# Patient Record
Sex: Male | Born: 1960 | Race: White | Hispanic: No | Marital: Married | Smoking: Never smoker
Health system: Southern US, Community
[De-identification: ages and names within clinical notes are randomized; demographics above are authoritative.]

## PROBLEM LIST (undated history)

## (undated) DIAGNOSIS — E785 Hyperlipidemia, unspecified: Secondary | ICD-10-CM

## (undated) DIAGNOSIS — K5792 Diverticulitis of intestine, part unspecified, without perforation or abscess without bleeding: Secondary | ICD-10-CM

## (undated) HISTORY — PX: KNEE SURGERY: SHX244

## (undated) HISTORY — DX: Hyperlipidemia, unspecified: E78.5

---

## 2006-08-30 ENCOUNTER — Ambulatory Visit: Payer: Self-pay | Admitting: Internal Medicine

## 2006-09-28 ENCOUNTER — Ambulatory Visit: Payer: Self-pay | Admitting: Internal Medicine

## 2007-03-20 ENCOUNTER — Encounter: Admission: RE | Admit: 2007-03-20 | Discharge: 2007-03-20 | Payer: Self-pay | Admitting: Orthopedic Surgery

## 2007-06-08 ENCOUNTER — Ambulatory Visit: Payer: Self-pay | Admitting: Internal Medicine

## 2007-06-26 DIAGNOSIS — E785 Hyperlipidemia, unspecified: Secondary | ICD-10-CM | POA: Insufficient documentation

## 2008-07-09 ENCOUNTER — Ambulatory Visit (HOSPITAL_COMMUNITY): Admission: RE | Admit: 2008-07-09 | Discharge: 2008-07-09 | Payer: Self-pay | Admitting: Internal Medicine

## 2010-05-20 ENCOUNTER — Emergency Department (HOSPITAL_COMMUNITY): Admission: EM | Admit: 2010-05-20 | Discharge: 2010-05-20 | Payer: Self-pay | Admitting: Emergency Medicine

## 2011-02-21 LAB — BASIC METABOLIC PANEL
Calcium: 9.1 mg/dL (ref 8.4–10.5)
Chloride: 105 mEq/L (ref 96–112)
Creatinine, Ser: 0.99 mg/dL (ref 0.4–1.5)
GFR calc non Af Amer: >60 mL/min (ref >60)
Glucose, Bld: 129 mg/dL — ABNORMAL HIGH (ref 70–99)
Sodium: 140 mEq/L (ref 135–145)

## 2011-02-21 LAB — DIFFERENTIAL
Basophils Absolute: 0 10*3/uL (ref 0.0–0.1)
Eosinophils Relative: 2 % (ref 0–5)
Lymphocytes Relative: 32 % (ref 12–46)
Monocytes Absolute: 0.5 10*3/uL (ref 0.1–1.0)
Monocytes Relative: 9 % (ref 3–12)
Neutrophils Relative %: 56 % (ref 43–77)

## 2011-02-21 LAB — CBC
Hemoglobin: 14.5 g/dL (ref 13.0–17.0)
MCHC: 34.9 g/dL (ref 30.0–36.0)
Platelets: 227 10*3/uL (ref 150–400)
RBC: 4.39 MIL/uL (ref 4.22–5.81)

## 2011-02-21 LAB — POCT CARDIAC MARKERS
CKMB, poc: <1 ng/mL — ABNORMAL LOW (ref 1.0–8.0)
Troponin i, poc: <0.05 ng/mL (ref 0.00–0.09)

## 2011-10-05 ENCOUNTER — Ambulatory Visit (INDEPENDENT_AMBULATORY_CARE_PROVIDER_SITE_OTHER): Payer: BC Managed Care – PPO | Admitting: Orthopedic Surgery

## 2011-10-05 ENCOUNTER — Encounter: Payer: Self-pay | Admitting: Orthopedic Surgery

## 2011-10-05 VITALS — BP 124/80 | Ht 68.0 in | Wt 170.0 lb

## 2011-10-05 DIAGNOSIS — M24519 Contracture, unspecified shoulder: Secondary | ICD-10-CM | POA: Insufficient documentation

## 2011-10-05 DIAGNOSIS — M67919 Unspecified disorder of synovium and tendon, unspecified shoulder: Secondary | ICD-10-CM

## 2011-10-05 DIAGNOSIS — M75101 Unspecified rotator cuff tear or rupture of right shoulder, not specified as traumatic: Secondary | ICD-10-CM | POA: Insufficient documentation

## 2011-10-05 MED ORDER — METHYLPREDNISOLONE ACETATE 40 MG/ML IJ SUSP: 40.0000 mg | Freq: Once | INTRAMUSCULAR | Status: AC

## 2011-10-05 NOTE — Patient Instructions (Signed)
You have received a steroid shot. 15% of patients experience increased pain at the injection site with in the next 24 hours. This is best treated with ice and tylenol extra strength 2 tabs every 8 hours. If you are still having pain please call the office.   The therabands are at [facility] "3 level bands"

## 2011-10-05 NOTE — Progress Notes (Signed)
Pain RIGHT shoulder x4 months.  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 Denies sharp, dull, throbbing, stabbing, or burning, numbness, tingling, or locking. Symptoms seem to come and go.  Review of systems negative times. All 14 systems.  Patient is very healthy, had surgery for a torn ligament, and takes Crestor.  History reviewed. No pertinent past medical history.  Past Surgical History  Procedure Date  . Knee surgery     Family History  Problem Relation Age of Onset  . Cancer    . Diabetes     Physical Exam(12) GENERAL: normal development   CDV: pulses are normal   Skin: normal  Lymph: nodes were not palpable/normal  Psychiatric: awake, alert and oriented  Neuro: normal sensation  MSK gait normal 1shoulder AC joint non tender  2 PROM was normal with pain at 150 degrees 3 stable shoulder  4 RC muscles normal  5 neck non tender  6 neck ROM is normal   Xrays:  Separately identifiable x-ray report   AP and lateral with 10 caudal tilt right  shoulder  Normal glenohumeral joint normal rotator outlet but no signs of chronic cuff disease  Impression normal shoulder x-rays   Assessment: RC syndrome, Adhesive capsulitis    Plan: injection HEP   Shoulder Injection Procedure Note   Pre-operative Diagnosis: right  RC Syndrome  Post-operative Diagnosis: same  Indications: pain   Anesthesia: ethyl chloride   Procedure Details   Verbal consent was obtained for the procedure. The shoulder was prepped withalcohol and the skin was anesthetized. A 20 gauge needle was advanced into the subacromial space through posterior approach without difficulty  The space was then injected with 3 ml 1% lidocaine and 1 ml of depomedrol. The injection site was cleansed with isopropyl alcohol and a dressing was applied.  Complications:  None; patient  tolerated the procedure well.

## 2011-11-16 ENCOUNTER — Ambulatory Visit: Payer: BC Managed Care – PPO | Admitting: Orthopedic Surgery

## 2015-06-30 ENCOUNTER — Other Ambulatory Visit (HOSPITAL_COMMUNITY): Payer: Self-pay | Admitting: Internal Medicine

## 2015-06-30 DIAGNOSIS — R42 Dizziness and giddiness: Secondary | ICD-10-CM

## 2015-07-02 ENCOUNTER — Ambulatory Visit (HOSPITAL_COMMUNITY)
Admission: RE | Admit: 2015-07-02 | Discharge: 2015-07-02 | Disposition: A | Discharge status: Home / Self Care | Payer: BLUE CROSS/BLUE SHIELD | Source: Ambulatory Visit | Attending: Internal Medicine | Admitting: Internal Medicine

## 2015-07-02 DIAGNOSIS — R42 Dizziness and giddiness: Secondary | ICD-10-CM

## 2015-07-02 DIAGNOSIS — I6522 Occlusion and stenosis of left carotid artery: Secondary | ICD-10-CM | POA: Diagnosis not present

## 2015-07-02 DIAGNOSIS — I6521 Occlusion and stenosis of right carotid artery: Secondary | ICD-10-CM | POA: Insufficient documentation

## 2015-08-08 IMAGING — US US CAROTID DUPLEX BILAT
1 series · 13 of 24 positions shown · non-contrast
Comparison: None.

CLINICAL DATA: 54-year-old male with a 1 year history of
intermittent

EXAM:
BILATERAL CAROTID DUPLEX ULTRASOUND
TECHNIQUE: Gray scale imaging, color Doppler and duplex ultrasound were
performed of bilateral carotid and vertebral arteries in the neck.

[Series 1: us carotid duplex bilat · 0.05mm/px · 13 of 73 slices shown]
[im 1/73]
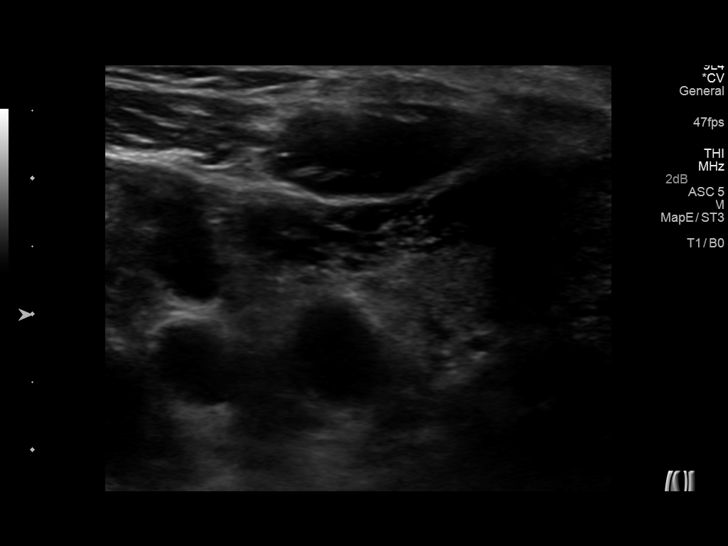
[im 7/73]
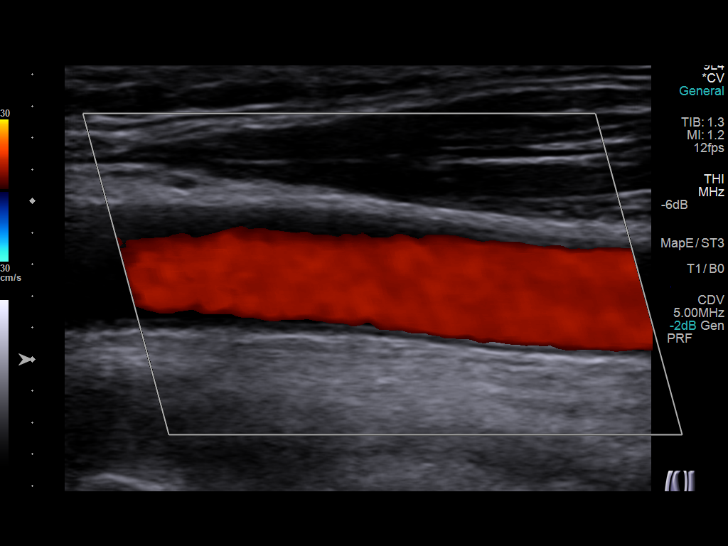
[im 13/73]
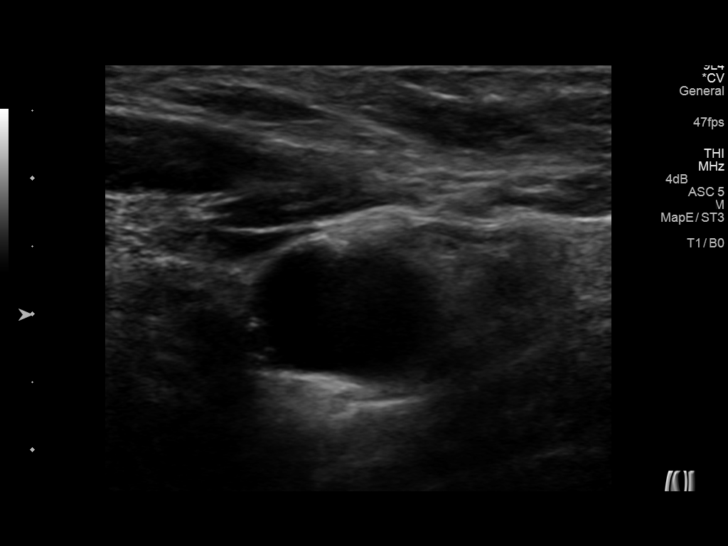
[im 19/73]
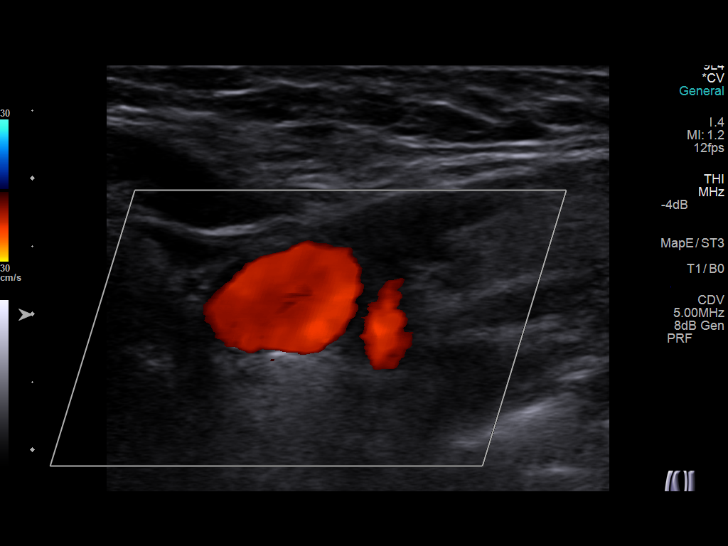
[im 26/73]
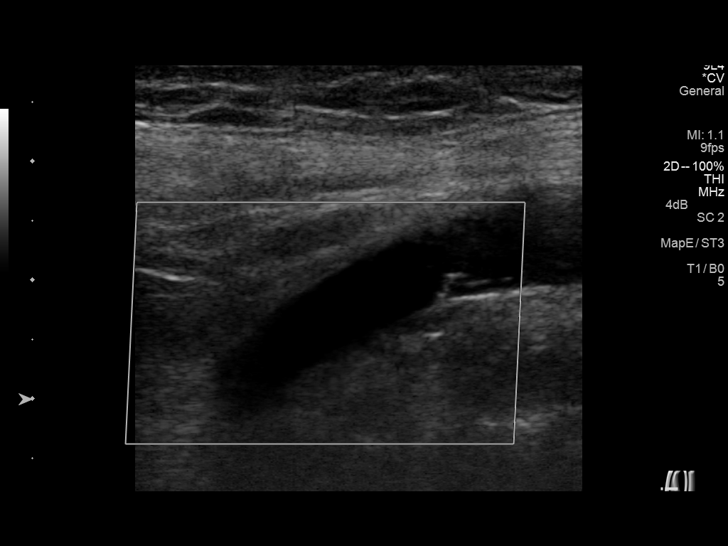
[im 32/73]
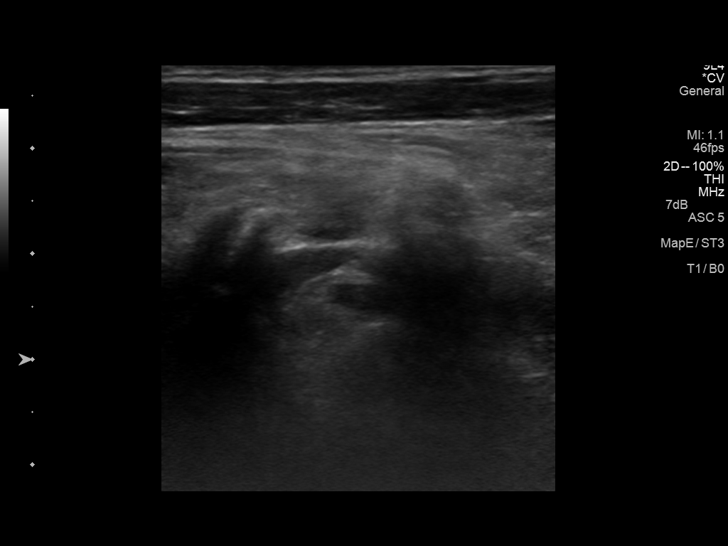
[im 38/73]
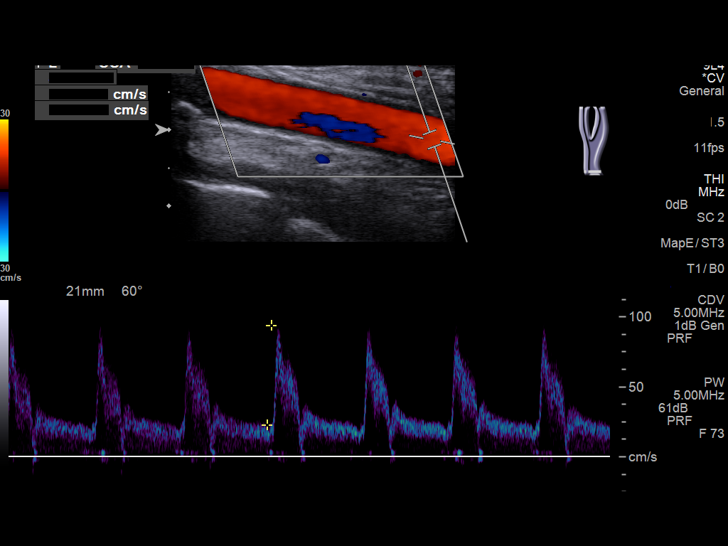
[im 41/73]
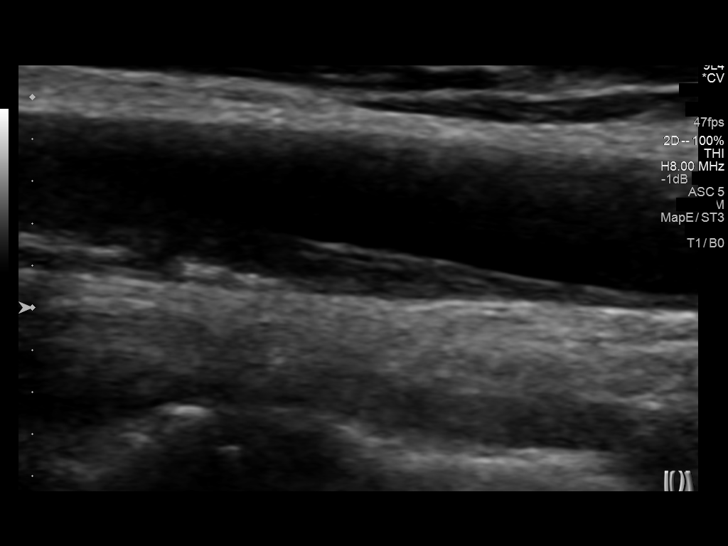
[im 47/73]
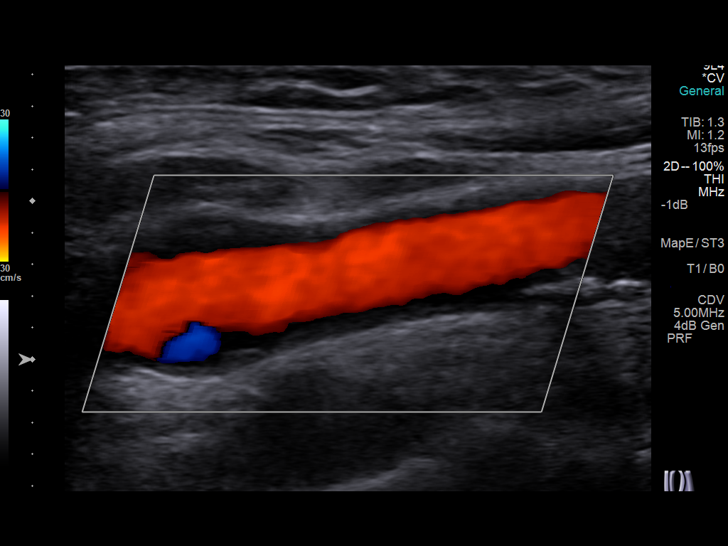
[im 54/73]
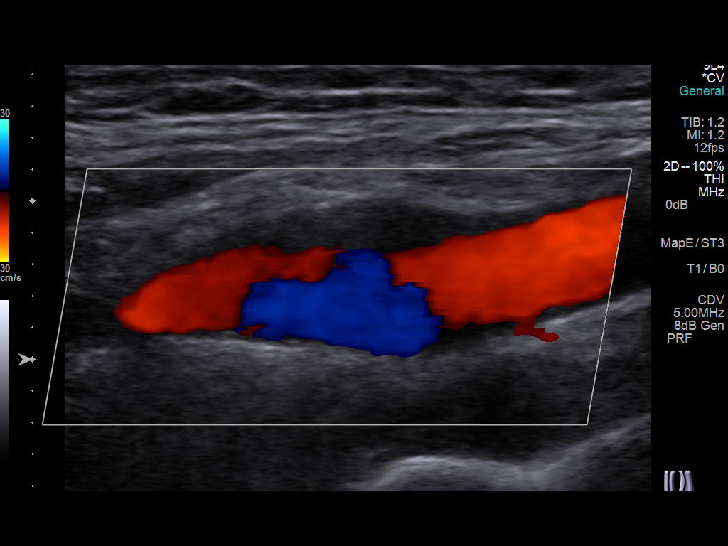
[im 60/73]
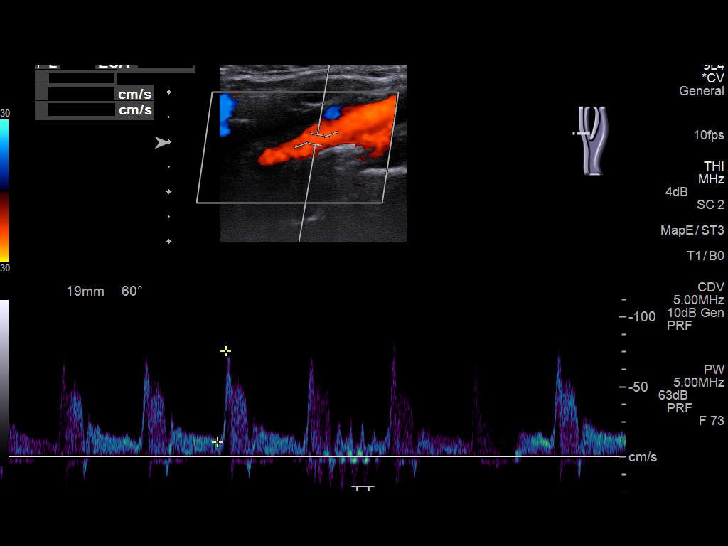
[im 66/73]
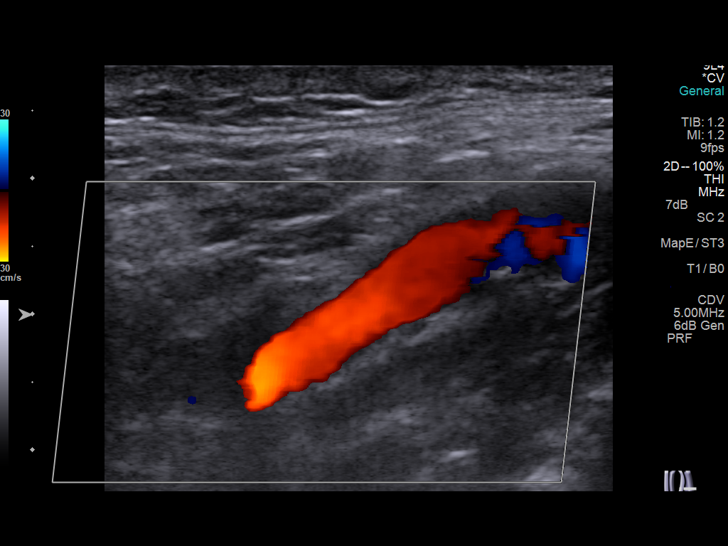
[im 73/73]
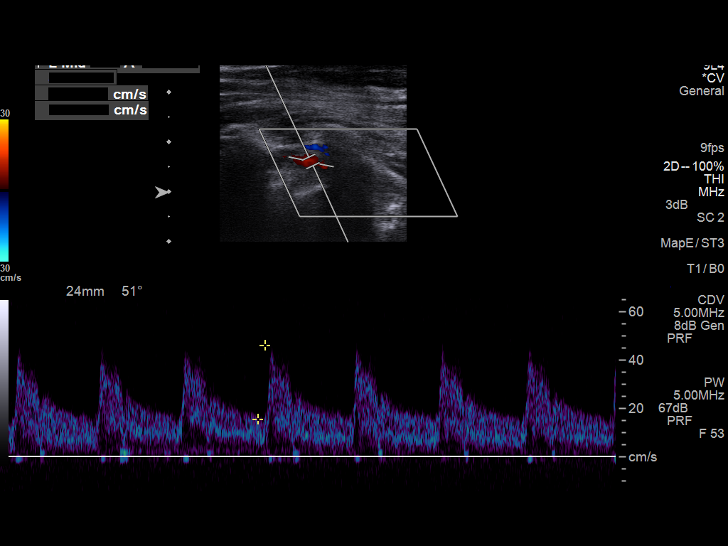

[13 of 24 positions shown; findings below may reference images not displayed]

FINDINGS: Criteria: Quantification of carotid stenosis is based on velocity
parameters that correlate the residual internal carotid diameter
with NASCET-based stenosis levels, using the diameter of the distal
internal carotid lumen as the denominator for stenosis measurement.

The following velocity measurements were obtained:

RIGHT

ICA:  81/35 cm/sec

CCA:  92/26 cm/sec

SYSTOLIC ICA/CCA RATIO:

DIASTOLIC ICA/CCA RATIO:

ECA:  85 cm/sec

LEFT

ICA:  54/26 cm/sec

CCA:  84/19 cm/sec

SYSTOLIC ICA/CCA RATIO:

DIASTOLIC ICA/CCA RATIO:

ECA:  76 cm/sec

RIGHT CAROTID ARTERY: Trace heterogeneous atherosclerotic plaque in
the internal carotid artery. By peak systolic velocity criteria, the
estimated stenosis remains less than 50%.

RIGHT VERTEBRAL ARTERY:  Patent with normal antegrade flow.

LEFT CAROTID ARTERY: Mild smooth heterogeneous plaque in the mid and
distal common carotid artery. No significant stenosis. Heterogeneous
plaque present within the proximal internal carotid artery. By peak
systolic velocity criteria, the estimated stenosis remains less than
50%.

LEFT VERTEBRAL ARTERY:  Patent with normal antegrade flow.
IMPRESSION: 1. Mild (1-49%) stenosis proximal right internal carotid artery
secondary to mild focal heterogeneous atherosclerotic plaque.
2. Mild (1-49%) stenosis proximal left internal carotid artery
secondary to smooth focal heterogeneous atherosclerotic plaque.
3. Vertebral arteries are patent with normal antegrade flow.

## 2016-01-22 ENCOUNTER — Ambulatory Visit (INDEPENDENT_AMBULATORY_CARE_PROVIDER_SITE_OTHER): Payer: BC Managed Care – PPO | Admitting: Otolaryngology

## 2016-05-09 ENCOUNTER — Encounter (HOSPITAL_COMMUNITY): Payer: Self-pay | Admitting: Emergency Medicine

## 2016-05-09 ENCOUNTER — Emergency Department (HOSPITAL_COMMUNITY)
Admission: EM | Admit: 2016-05-09 | Discharge: 2016-05-09 | Disposition: A | Discharge status: Home / Self Care | Payer: BLUE CROSS/BLUE SHIELD | Attending: Emergency Medicine | Admitting: Emergency Medicine

## 2016-05-09 DIAGNOSIS — R1032 Left lower quadrant pain: Secondary | ICD-10-CM | POA: Diagnosis not present

## 2016-05-09 HISTORY — DX: Diverticulitis of intestine, part unspecified, without perforation or abscess without bleeding: K57.92

## 2016-05-09 LAB — CBC WITH DIFFERENTIAL/PLATELET
BASOS ABS: 0 10*3/uL (ref 0.0–0.1)
Basophils Relative: 0 %
Eosinophils Absolute: 0.1 10*3/uL (ref 0.0–0.7)
Eosinophils Relative: 1 %
HEMATOCRIT: 41.4 % (ref 39.0–52.0)
Hemoglobin: 13.7 g/dL (ref 13.0–17.0)
LYMPHS PCT: 18 %
Lymphs Abs: 1.9 10*3/uL (ref 0.7–4.0)
MCH: 31.1 pg (ref 26.0–34.0)
MCHC: 33.1 g/dL (ref 30.0–36.0)
MCV: 94.1 fL (ref 78.0–100.0)
MONO ABS: 1.1 10*3/uL — AB (ref 0.1–1.0)
Monocytes Relative: 11 %
NEUTROS ABS: 7.3 10*3/uL (ref 1.7–7.7)
Neutrophils Relative %: 70 %
Platelets: 238 10*3/uL (ref 150–400)
RBC: 4.4 MIL/uL (ref 4.22–5.81)
RDW: 12.4 % (ref 11.5–15.5)
WBC: 10.5 10*3/uL (ref 4.0–10.5)

## 2016-05-09 LAB — BASIC METABOLIC PANEL
ANION GAP: 6 (ref 5–15)
BUN: 12 mg/dL (ref 6–20)
CO2: 28 mmol/L (ref 22–32)
Calcium: 8.8 mg/dL — ABNORMAL LOW (ref 8.9–10.3)
Chloride: 104 mmol/L (ref 101–111)
Creatinine, Ser: 1.03 mg/dL (ref 0.61–1.24)
GFR calc Af Amer: >60 mL/min (ref >60)
GFR calc non Af Amer: >60 mL/min (ref >60)
GLUCOSE: 118 mg/dL — AB (ref 65–99)
POTASSIUM: 4.1 mmol/L (ref 3.5–5.1)
Sodium: 138 mmol/L (ref 135–145)

## 2016-05-09 LAB — URINALYSIS, ROUTINE W REFLEX MICROSCOPIC
Bilirubin Urine: NEGATIVE
GLUCOSE, UA: NEGATIVE mg/dL
Hgb urine dipstick: NEGATIVE
Ketones, ur: NEGATIVE mg/dL
LEUKOCYTES UA: NEGATIVE
Nitrite: NEGATIVE
PH: 7.5 (ref 5.0–8.0)
Protein, ur: NEGATIVE mg/dL
SPECIFIC GRAVITY, URINE: 1.01 (ref 1.005–1.030)

## 2016-05-09 MED ORDER — METRONIDAZOLE 500 MG PO TABS
500.0000 mg | ORAL_TABLET | Freq: Once | ORAL | Status: AC
  Administered 2016-05-09: 500 mg via ORAL
  Filled 2016-05-09: qty 1

## 2016-05-09 MED ORDER — IBUPROFEN 800 MG PO TABS: 800.0000 mg | ORAL_TABLET | Freq: Three times a day (TID) | ORAL | Status: DC

## 2016-05-09 MED ORDER — HYDROCODONE-ACETAMINOPHEN 5-325 MG PO TABS: 2.0000 | ORAL_TABLET | ORAL | Status: DC | PRN

## 2016-05-09 MED ORDER — CIPROFLOXACIN IN D5W 400 MG/200ML IV SOLN
400.0000 mg | Freq: Once | INTRAVENOUS | Status: AC
  Administered 2016-05-09: 400 mg via INTRAVENOUS
  Filled 2016-05-09: qty 200

## 2016-05-09 MED ORDER — METRONIDAZOLE 500 MG PO TABS: 500.0000 mg | ORAL_TABLET | Freq: Two times a day (BID) | ORAL | Status: DC

## 2016-05-09 MED ORDER — CIPROFLOXACIN HCL 500 MG PO TABS: 500.0000 mg | ORAL_TABLET | Freq: Two times a day (BID) | ORAL | Status: DC

## 2016-05-09 NOTE — Discharge Instructions (Signed)

## 2016-05-09 NOTE — ED Notes (Signed)
Patient c/o lower abd pain. Denies any nausea, vomiting, diarrhea, or urinary symptoms. Patient unsure of any fevers but reports chills last night. Per patient last normal BM yesterday-no blood noted.

## 2016-05-09 NOTE — ED Provider Notes (Signed)
CSN: [id number]     Arrival date & time 05/09/16  [id number] History   First MD Initiated Contact with Patient 05/09/16 [phone]     Chief Complaint  Patient presents with  . Abdominal Pain     (Consider location/radiation/quality/duration/timing/severity/associated sxs/prior Treatment) HPI Comments: The patient is a 55 year old male who has a history of diverticulitis a couple of years ago, he has no other prior abdominal surgical history. He reports over the last couple of days he has had some left lower quadrant abdominal pain but no nausea vomiting diarrhea constipation changes and bloody stools, urinary discomfort or frequency and no radiation of the pain to the right side. He has no fevers or chills, he has no other complaints, the pain has been constant, it is gradually intensifying.  Patient is a 55 y.o. male presenting with abdominal pain. The history is provided by the patient.  Abdominal Pain   Past Medical History  Diagnosis Date  . Diverticulitis    Past Surgical History  Procedure Laterality Date  . Knee surgery     Family History  Problem Relation Age of Onset  . Cancer    . Diabetes     Social History  Substance Use Topics  . Smoking status: Never Smoker   . Smokeless tobacco: None  . Alcohol Use: No     Comment: 3 drinks a week    Review of Systems  Gastrointestinal: Positive for abdominal pain.  All other systems reviewed and are negative.     Allergies  Review of patient's allergies indicates no known allergies.  Home Medications   Prior to Admission medications   Medication Sig Start Date End Date Taking? Authorizing Provider  ciprofloxacin (CIPRO) 500 MG tablet Take 1 tablet (500 mg total) by mouth every 12 (twelve) hours. 05/09/16   Eber HongBrian Loeta Herst, MD  HYDROcodone-acetaminophen (NORCO/VICODIN) 5-325 MG tablet Take 2 tablets by mouth every 4 (four) hours as needed. 05/09/16   Eber HongBrian Quintavius Niebuhr, MD  ibuprofen (ADVIL,MOTRIN) 800 MG tablet Take 1 tablet (800 mg total)  by mouth 3 (three) times daily. 05/09/16   Eber HongBrian Shooter Tangen, MD  metroNIDAZOLE (FLAGYL) 500 MG tablet Take 1 tablet (500 mg total) by mouth 2 (two) times daily. 05/09/16   Eber HongBrian Austyn Seier, MD  Rosuvastatin Calcium (CRESTOR PO) Take by mouth.      Historical Provider, MD   BP 132/81 mmHg  Pulse 80  Temp(Src) 98.6 F (37 C) (Oral)  Resp 18  Ht 5\' 8"  (1.727 m)  Wt 168 lb (76.204 kg)  BMI 25.55 kg/m2  SpO2 95% Physical Exam  Constitutional: He appears well-developed and well-nourished. No distress.  HENT:  Head: Normocephalic and atraumatic.  Mouth/Throat: Oropharynx is clear and moist. No oropharyngeal exudate.  Eyes: Conjunctivae and EOM are normal. Pupils are equal, round, and reactive to light. Right eye exhibits no discharge. Left eye exhibits no discharge. No scleral icterus.  Neck: Normal range of motion. Neck supple. No JVD present. No thyromegaly present.  Cardiovascular: Normal rate, regular rhythm, normal heart sounds and intact distal pulses.  Exam reveals no gallop and no friction rub.   No murmur heard. Pulmonary/Chest: Effort normal and breath sounds normal. No respiratory distress. He has no wheezes. He has no rales.  Abdominal: Soft. Bowel sounds are normal. He exhibits no distension and no mass. There is tenderness (focal tenderness to the left lower quadrant and left mid abdomen, no guarding, no peritoneal signs, no rigidity). There is no rebound and no guarding.  Musculoskeletal: Normal range  of motion. He exhibits no edema or tenderness.  Lymphadenopathy:    He has no cervical adenopathy.  Neurological: He is alert. Coordination normal.  Skin: Skin is warm and dry. No rash noted. No erythema.  Psychiatric: He has a normal mood and affect. His behavior is normal.  Nursing note and vitals reviewed.   ED Course  Procedures (including critical care time) Labs Review Labs Reviewed  CBC WITH DIFFERENTIAL/PLATELET - Abnormal; Notable for the following:    Monocytes Absolute 1.1  (*)    All other components within normal limits  BASIC METABOLIC PANEL - Abnormal; Notable for the following:    Glucose, Bld 118 (*)    Calcium 8.8 (*)    All other components within normal limits  URINALYSIS, ROUTINE W REFLEX MICROSCOPIC (NOT AT [facility])    Imaging Review No results found. I have personally reviewed and evaluated these images and lab results as part of my medical decision-making.   MDM   Final diagnoses:  Left lower quadrant pain    The patient's exam is unremarkable other than his abdominal pain. His exam is not consistent with perforation, there is no fever or tachycardia to suggest an abscess or perforation, I will obtain blood counts, place an IV, he will need IV antibiotics if he has diverticulitis, he appears very comfortable and has declined pain medications at this time. Doubt abscess or perforation. We'll proceed with CT scan imaging only for severe elevation in white blood cell count or worsening of clinical condition. I discussed this plan with the patient, he is in agreement to proceed without CT scan at this time.  Would also consider Kidney Stone or UTI / pyelo - but less likely - UA ordered  Labs reassuring meds as below Pt informed of results.  Meds given in ED:  Medications  ciprofloxacin (CIPRO) IVPB 400 mg (not administered)  metroNIDAZOLE (FLAGYL) tablet 500 mg (not administered)    New Prescriptions   CIPROFLOXACIN (CIPRO) 500 MG TABLET    Take 1 tablet (500 mg total) by mouth every 12 (twelve) hours.   HYDROCODONE-ACETAMINOPHEN (NORCO/VICODIN) 5-325 MG TABLET    Take 2 tablets by mouth every 4 (four) hours as needed.   IBUPROFEN (ADVIL,MOTRIN) 800 MG TABLET    Take 1 tablet (800 mg total) by mouth 3 (three) times daily.   METRONIDAZOLE (FLAGYL) 500 MG TABLET    Take 1 tablet (500 mg total) by mouth 2 (two) times daily.      Eber Hong, MD 05/09/16 [phone]

## 2016-05-18 ENCOUNTER — Other Ambulatory Visit: Payer: Self-pay | Admitting: Otolaryngology

## 2016-05-18 DIAGNOSIS — H9191 Unspecified hearing loss, right ear: Secondary | ICD-10-CM

## 2016-06-01 ENCOUNTER — Ambulatory Visit
Admission: RE | Admit: 2016-06-01 | Discharge: 2016-06-01 | Disposition: A | Discharge status: Home / Self Care | Payer: BLUE CROSS/BLUE SHIELD | Source: Ambulatory Visit | Attending: Otolaryngology | Admitting: Otolaryngology

## 2016-06-01 DIAGNOSIS — H9191 Unspecified hearing loss, right ear: Secondary | ICD-10-CM

## 2016-06-01 MED ORDER — GADOBENATE DIMEGLUMINE 529 MG/ML IV SOLN
15.0000 mL | Freq: Once | INTRAVENOUS | Status: AC | PRN
Start: 2016-06-01 — End: 2016-06-01
  Administered 2016-06-01: 15 mL via INTRAVENOUS

## 2016-06-10 ENCOUNTER — Ambulatory Visit (INDEPENDENT_AMBULATORY_CARE_PROVIDER_SITE_OTHER): Payer: BLUE CROSS/BLUE SHIELD | Admitting: Otolaryngology

## 2016-06-10 DIAGNOSIS — H9041 Sensorineural hearing loss, unilateral, right ear, with unrestricted hearing on the contralateral side: Secondary | ICD-10-CM | POA: Diagnosis not present

## 2016-06-10 DIAGNOSIS — H8109 Meniere's disease, unspecified ear: Secondary | ICD-10-CM

## 2016-06-10 DIAGNOSIS — R42 Dizziness and giddiness: Secondary | ICD-10-CM | POA: Diagnosis not present

## 2016-06-16 DIAGNOSIS — H9313 Tinnitus, bilateral: Secondary | ICD-10-CM | POA: Diagnosis not present

## 2016-06-23 DIAGNOSIS — H9313 Tinnitus, bilateral: Secondary | ICD-10-CM | POA: Diagnosis not present

## 2016-07-07 DIAGNOSIS — H9313 Tinnitus, bilateral: Secondary | ICD-10-CM | POA: Diagnosis not present

## 2016-07-14 DIAGNOSIS — H9313 Tinnitus, bilateral: Secondary | ICD-10-CM | POA: Diagnosis not present

## 2016-07-21 DIAGNOSIS — H9313 Tinnitus, bilateral: Secondary | ICD-10-CM | POA: Diagnosis not present

## 2016-08-04 DIAGNOSIS — H9313 Tinnitus, bilateral: Secondary | ICD-10-CM | POA: Diagnosis not present

## 2016-08-09 DIAGNOSIS — H9313 Tinnitus, bilateral: Secondary | ICD-10-CM | POA: Diagnosis not present

## 2016-09-16 ENCOUNTER — Ambulatory Visit (INDEPENDENT_AMBULATORY_CARE_PROVIDER_SITE_OTHER): Payer: BLUE CROSS/BLUE SHIELD | Admitting: Otolaryngology

## 2016-09-16 DIAGNOSIS — R42 Dizziness and giddiness: Secondary | ICD-10-CM | POA: Diagnosis not present

## 2016-09-16 DIAGNOSIS — H8101 Meniere's disease, right ear: Secondary | ICD-10-CM

## 2016-09-16 DIAGNOSIS — H9311 Tinnitus, right ear: Secondary | ICD-10-CM | POA: Diagnosis not present

## 2016-09-16 DIAGNOSIS — H8112 Benign paroxysmal vertigo, left ear: Secondary | ICD-10-CM

## 2016-09-16 DIAGNOSIS — H9041 Sensorineural hearing loss, unilateral, right ear, with unrestricted hearing on the contralateral side: Secondary | ICD-10-CM

## 2016-10-06 DIAGNOSIS — H9313 Tinnitus, bilateral: Secondary | ICD-10-CM | POA: Diagnosis not present

## 2016-10-13 DIAGNOSIS — H9313 Tinnitus, bilateral: Secondary | ICD-10-CM | POA: Diagnosis not present

## 2016-10-27 DIAGNOSIS — H9313 Tinnitus, bilateral: Secondary | ICD-10-CM | POA: Diagnosis not present

## 2017-04-28 DIAGNOSIS — Z6824 Body mass index (BMI) 24.0-24.9, adult: Secondary | ICD-10-CM | POA: Diagnosis not present

## 2017-04-28 DIAGNOSIS — E785 Hyperlipidemia, unspecified: Secondary | ICD-10-CM | POA: Diagnosis not present

## 2017-04-28 DIAGNOSIS — Z0001 Encounter for general adult medical examination with abnormal findings: Secondary | ICD-10-CM | POA: Diagnosis not present

## 2017-12-23 DIAGNOSIS — L570 Actinic keratosis: Secondary | ICD-10-CM | POA: Diagnosis not present

## 2018-05-08 DIAGNOSIS — J029 Acute pharyngitis, unspecified: Secondary | ICD-10-CM | POA: Diagnosis not present

## 2018-05-08 DIAGNOSIS — Z6825 Body mass index (BMI) 25.0-25.9, adult: Secondary | ICD-10-CM | POA: Diagnosis not present

## 2018-06-23 DIAGNOSIS — D485 Neoplasm of uncertain behavior of skin: Secondary | ICD-10-CM | POA: Diagnosis not present

## 2018-06-23 DIAGNOSIS — L820 Inflamed seborrheic keratosis: Secondary | ICD-10-CM | POA: Diagnosis not present

## 2018-06-23 DIAGNOSIS — D2372 Other benign neoplasm of skin of left lower limb, including hip: Secondary | ICD-10-CM | POA: Diagnosis not present

## 2018-07-20 DIAGNOSIS — R1030 Lower abdominal pain, unspecified: Secondary | ICD-10-CM | POA: Diagnosis not present

## 2018-07-21 ENCOUNTER — Other Ambulatory Visit: Payer: Self-pay | Admitting: *Deleted

## 2018-07-21 ENCOUNTER — Ambulatory Visit (HOSPITAL_COMMUNITY): Payer: BLUE CROSS/BLUE SHIELD

## 2018-07-21 ENCOUNTER — Other Ambulatory Visit (HOSPITAL_COMMUNITY): Payer: Self-pay | Admitting: *Deleted

## 2018-07-21 DIAGNOSIS — R1032 Left lower quadrant pain: Secondary | ICD-10-CM

## 2018-07-21 DIAGNOSIS — N50812 Left testicular pain: Secondary | ICD-10-CM

## 2018-07-28 ENCOUNTER — Ambulatory Visit
Admission: RE | Admit: 2018-07-28 | Discharge: 2018-07-28 | Disposition: A | Discharge status: Home / Self Care | Payer: BLUE CROSS/BLUE SHIELD | Source: Ambulatory Visit | Attending: *Deleted | Admitting: *Deleted

## 2018-07-28 DIAGNOSIS — N50812 Left testicular pain: Secondary | ICD-10-CM

## 2018-07-28 DIAGNOSIS — R1032 Left lower quadrant pain: Secondary | ICD-10-CM

## 2018-07-28 DIAGNOSIS — N433 Hydrocele, unspecified: Secondary | ICD-10-CM | POA: Diagnosis not present

## 2018-07-31 ENCOUNTER — Other Ambulatory Visit: Payer: BLUE CROSS/BLUE SHIELD

## 2018-08-17 DIAGNOSIS — F5101 Primary insomnia: Secondary | ICD-10-CM | POA: Diagnosis not present

## 2018-08-17 DIAGNOSIS — E785 Hyperlipidemia, unspecified: Secondary | ICD-10-CM | POA: Diagnosis not present

## 2018-08-17 DIAGNOSIS — N5089 Other specified disorders of the male genital organs: Secondary | ICD-10-CM | POA: Diagnosis not present

## 2018-08-17 DIAGNOSIS — K4090 Unilateral inguinal hernia, without obstruction or gangrene, not specified as recurrent: Secondary | ICD-10-CM | POA: Diagnosis not present

## 2018-08-31 DIAGNOSIS — N50812 Left testicular pain: Secondary | ICD-10-CM | POA: Diagnosis not present

## 2018-08-31 DIAGNOSIS — Z125 Encounter for screening for malignant neoplasm of prostate: Secondary | ICD-10-CM | POA: Diagnosis not present

## 2018-09-03 IMAGING — US US SCROTUM W/ DOPPLER COMPLETE
1 series · 13 of 25 positions shown · non-contrast
Comparison: None.

CLINICAL DATA: 57 y/o  M; left testicle and groin pain for 2 weeks.

EXAM:
SCROTAL ULTRASOUND
DOPPLER ULTRASOUND OF THE TESTICLES
TECHNIQUE: Complete ultrasound examination of the testicles, epididymis, and
other scrotal structures was performed. Color and spectral Doppler
ultrasound were also utilized to evaluate blood flow to the
testicles.

[Series 1: us scrotum w/ doppler complete · 0.08mm/px · 89 acquisitions, 13 frames shown]
[im 1/89]
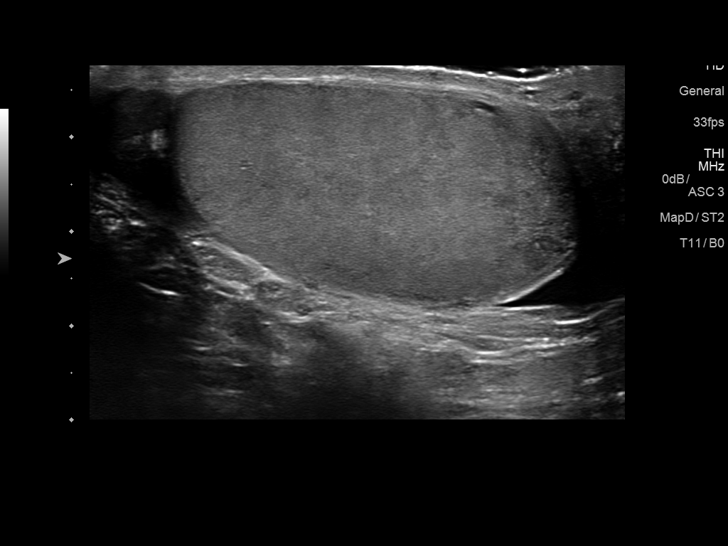
[im 8/89]
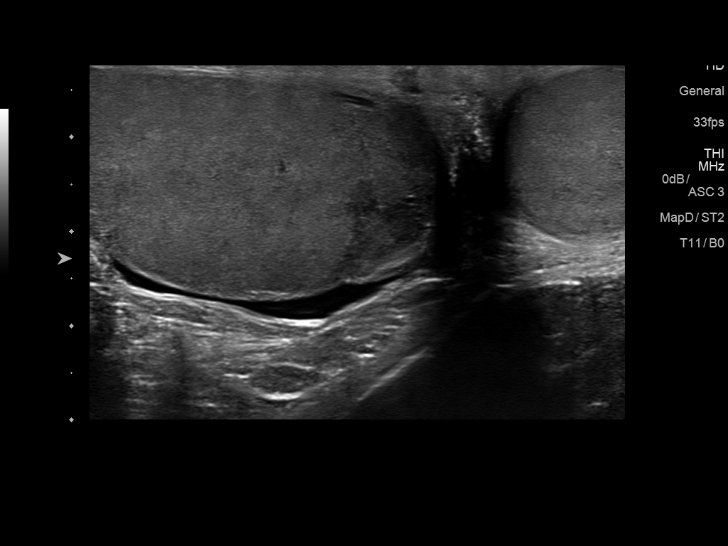
[im 15/89]
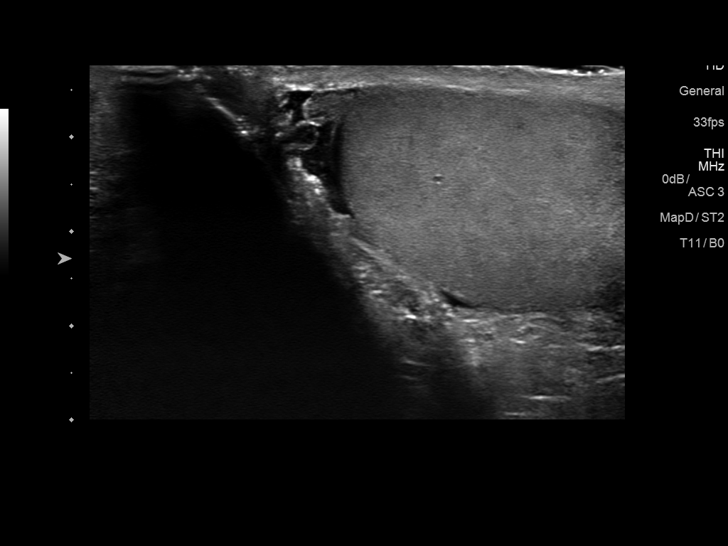
[im 23/89]
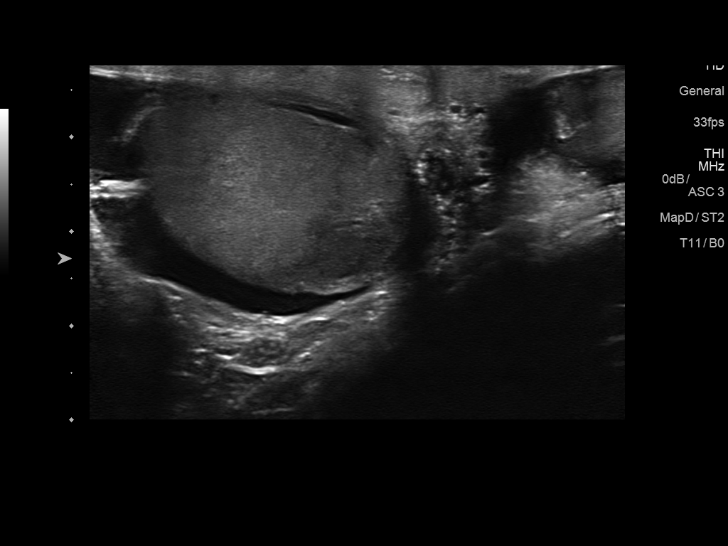
[im 30/89]
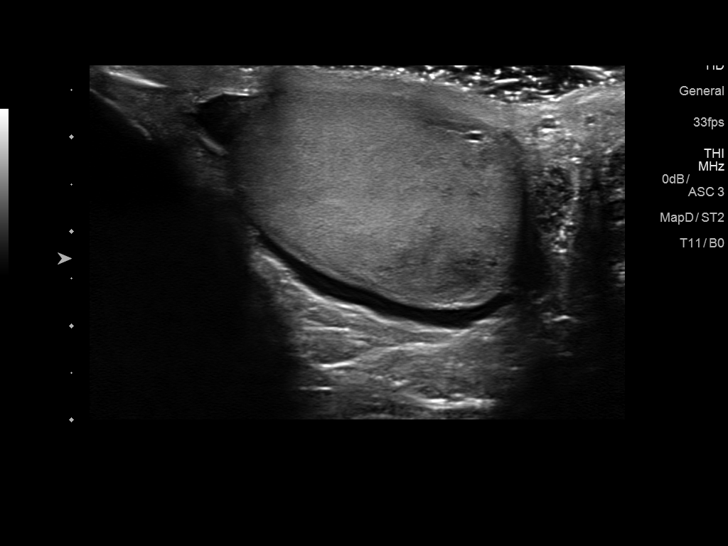
[im 37/89]
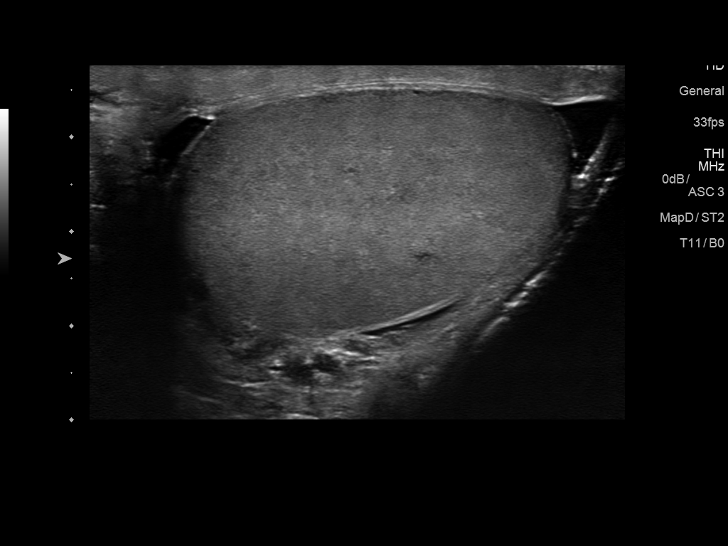
[im 45/89]
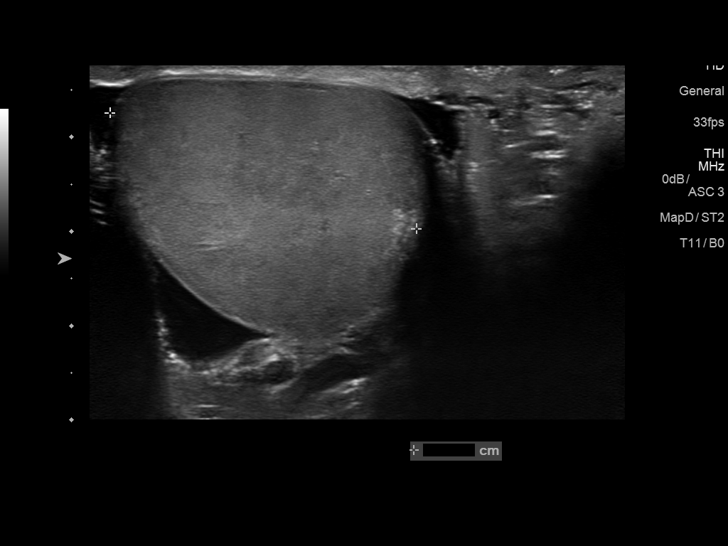
[im 52/89]
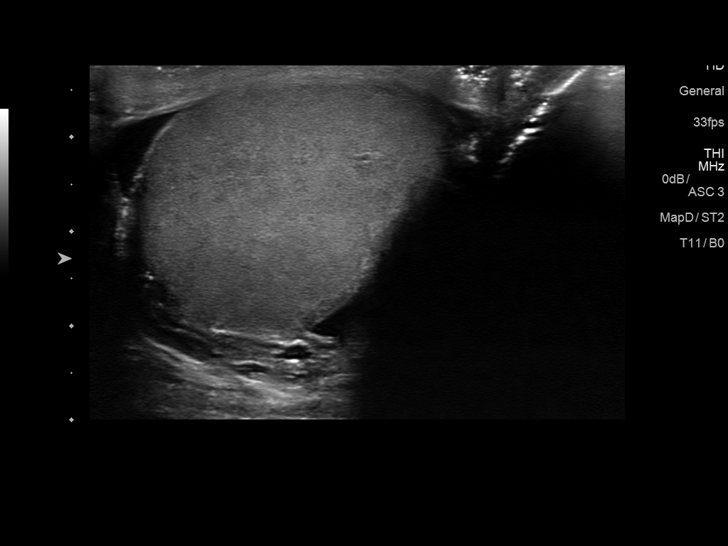
[im 59/89]
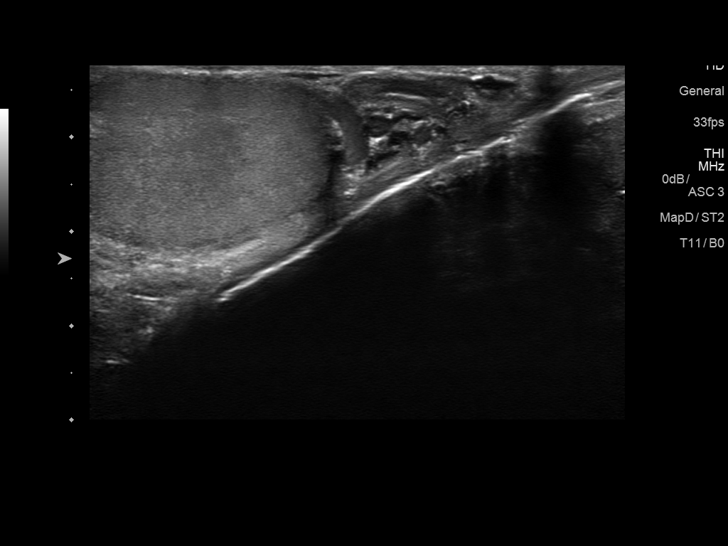
[im 67/89]
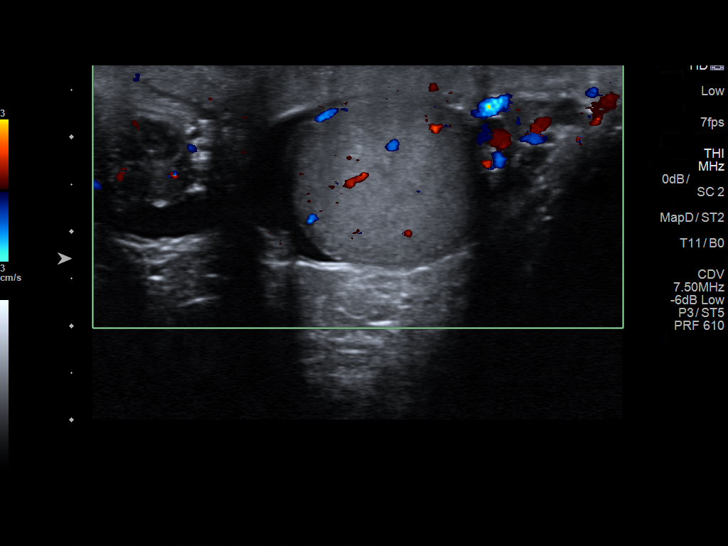
[im 74/89]
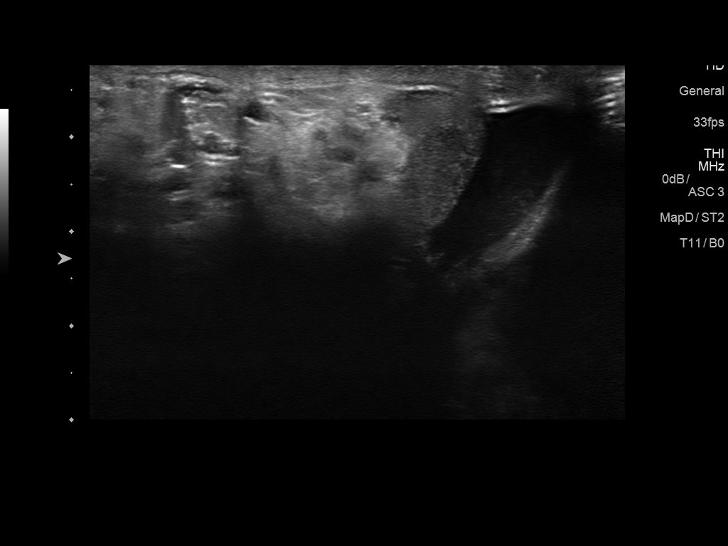
[im 81/89]
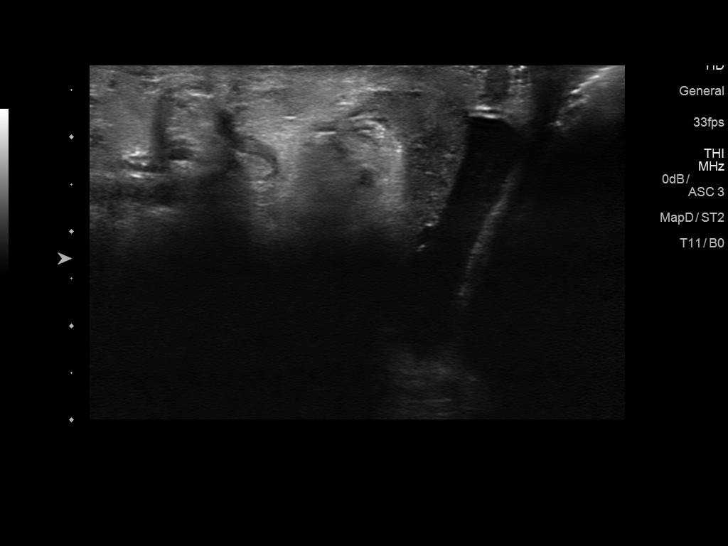
[im 89/89]
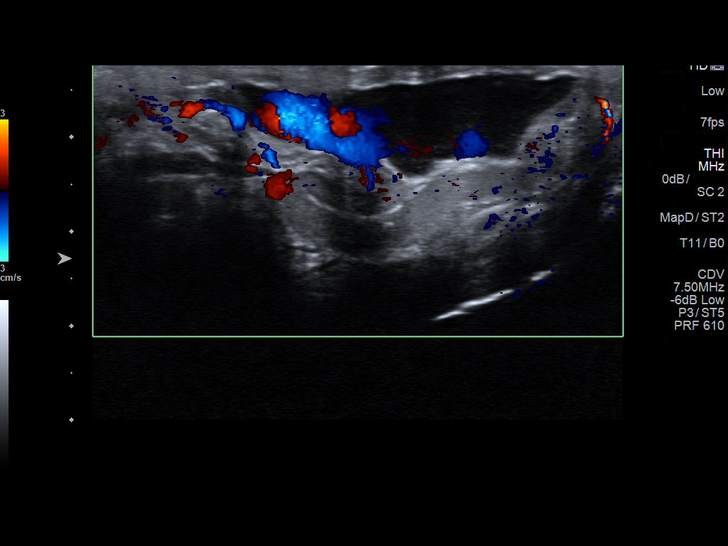

[13 of 25 positions shown; findings below may reference images not displayed]

FINDINGS: Right testicle

Measurements: 4.3 x 2.8 x 3.6 cm. Hypoechoic focus within the
inferior right testis measuring 8 x 7 x 8 mm. Blood flow detected
within the area on color Doppler.

Left testicle

Measurements: 4.3 x 2.7 x 3.2 cm. No mass or microlithiasis
visualized.

Right epididymis:  6 mm cyst.  5 mm appendix.

Left epididymis:  5 mm cyst.

Hydrocele: Bilateral hydrocele. There is debris within the
left-sided hydrocele.

Varicocele:  None visualized.

Pulsed Doppler interrogation of both testes demonstrates normal low
resistance arterial and venous waveforms bilaterally.

Other: Bulging structure within left inguinal canal without bowel
wall signal, possibly small hernia containing fat.
IMPRESSION: 1. No findings of testicular torsion or epididymo-orchitis at this
time.
2. Nonspecific 8 mm hypoechoic focus within the inferior right
testis with blood flow. Findings may represent sequelae of old
trauma or testicular neoplasm including testicular carcinoma,
lymphoma, metastasis, or stromal tumor.
3. Bilateral hydrocele with debris in the left-sided hydrocele.
4. Bulging structure left inguinal canal without bowel wall signal,
possibly a small hernia containing fat.

By: Tung Leiva M.D.

## 2018-09-05 DIAGNOSIS — J069 Acute upper respiratory infection, unspecified: Secondary | ICD-10-CM | POA: Diagnosis not present

## 2018-09-12 DIAGNOSIS — R93811 Abnormal radiologic findings on diagnostic imaging of right testicle: Secondary | ICD-10-CM | POA: Diagnosis not present

## 2018-09-12 DIAGNOSIS — K4091 Unilateral inguinal hernia, without obstruction or gangrene, recurrent: Secondary | ICD-10-CM | POA: Diagnosis not present

## 2018-09-15 DIAGNOSIS — E785 Hyperlipidemia, unspecified: Secondary | ICD-10-CM | POA: Diagnosis not present

## 2018-09-15 DIAGNOSIS — N401 Enlarged prostate with lower urinary tract symptoms: Secondary | ICD-10-CM | POA: Diagnosis not present

## 2018-09-24 DIAGNOSIS — J019 Acute sinusitis, unspecified: Secondary | ICD-10-CM | POA: Diagnosis not present

## 2018-10-04 ENCOUNTER — Ambulatory Visit
Admission: RE | Admit: 2018-10-04 | Discharge: 2018-10-04 | Disposition: A | Discharge status: Home / Self Care | Payer: BLUE CROSS/BLUE SHIELD | Source: Ambulatory Visit | Attending: *Deleted | Admitting: *Deleted

## 2018-10-04 ENCOUNTER — Other Ambulatory Visit: Payer: Self-pay | Admitting: Urology

## 2018-10-04 DIAGNOSIS — N433 Hydrocele, unspecified: Secondary | ICD-10-CM | POA: Diagnosis not present

## 2018-10-04 DIAGNOSIS — N50812 Left testicular pain: Secondary | ICD-10-CM

## 2018-10-04 DIAGNOSIS — R1032 Left lower quadrant pain: Secondary | ICD-10-CM

## 2018-10-11 ENCOUNTER — Other Ambulatory Visit: Payer: BLUE CROSS/BLUE SHIELD

## 2018-10-24 DIAGNOSIS — N50812 Left testicular pain: Secondary | ICD-10-CM | POA: Diagnosis not present

## 2018-10-24 DIAGNOSIS — R93811 Abnormal radiologic findings on diagnostic imaging of right testicle: Secondary | ICD-10-CM | POA: Diagnosis not present

## 2018-10-26 DIAGNOSIS — E785 Hyperlipidemia, unspecified: Secondary | ICD-10-CM | POA: Diagnosis not present

## 2018-10-26 DIAGNOSIS — N5089 Other specified disorders of the male genital organs: Secondary | ICD-10-CM | POA: Diagnosis not present

## 2018-10-30 DIAGNOSIS — Z23 Encounter for immunization: Secondary | ICD-10-CM | POA: Diagnosis not present

## 2018-10-30 DIAGNOSIS — R229 Localized swelling, mass and lump, unspecified: Secondary | ICD-10-CM | POA: Diagnosis not present

## 2018-11-10 IMAGING — US US SCROTUM W/ DOPPLER COMPLETE
1 series · 13 of 25 positions shown · non-contrast
Comparison: None.

07/28/2018.

CLINICAL DATA: Left testicular pain. Followup 8 mm inferior right
testicle hypoechoic area.

EXAM:
SCROTAL ULTRASOUND
DOPPLER ULTRASOUND OF THE TESTICLES
TECHNIQUE: Complete ultrasound examination of the testicles, epididymis, and
other scrotal structures was performed. Color and spectral Doppler
ultrasound were also utilized to evaluate blood flow to the
testicles.

[Series 1: us scrotum w/ doppler complete · 0.06mm/px · 13 of 119 slices shown]
[im 1/119]
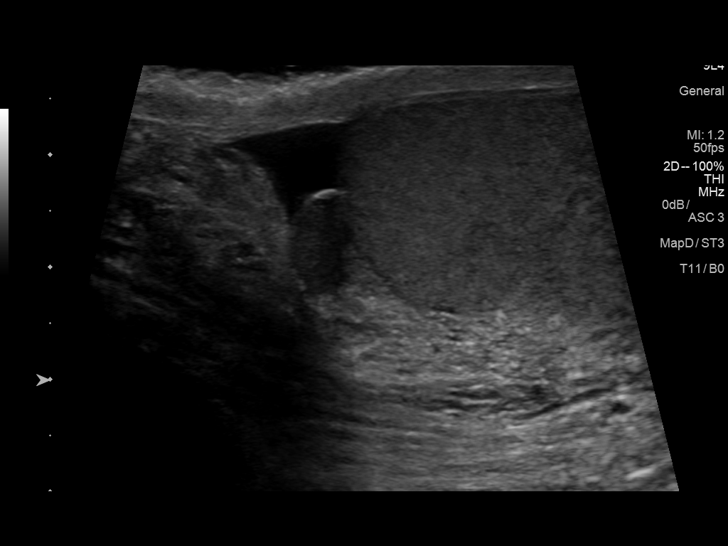
[im 10/119]
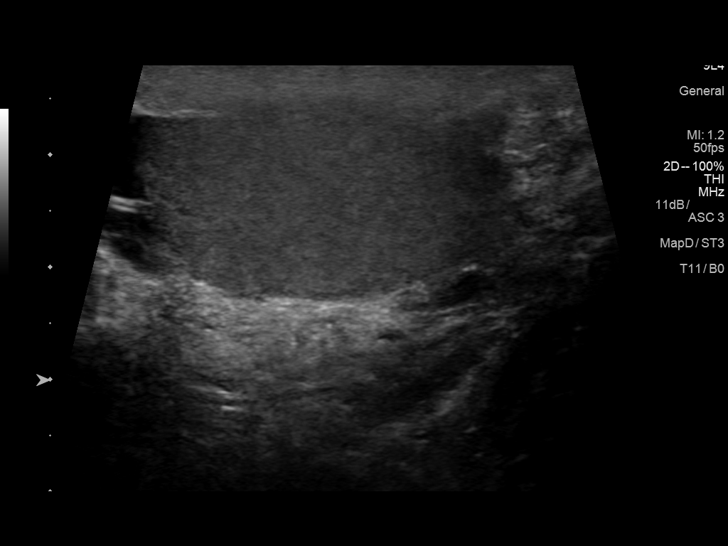
[im 20/119]
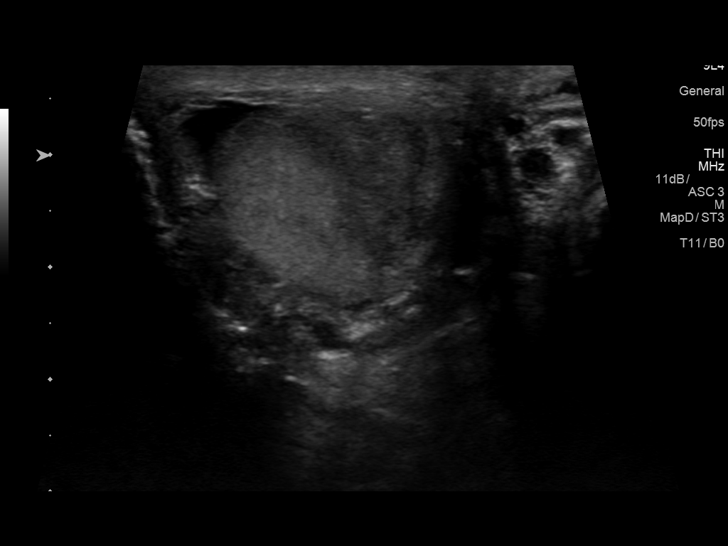
[im 30/119]
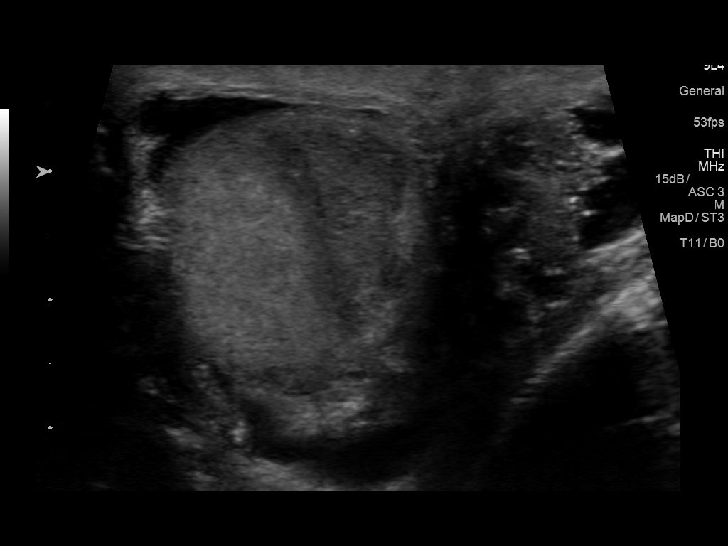
[im 40/119]
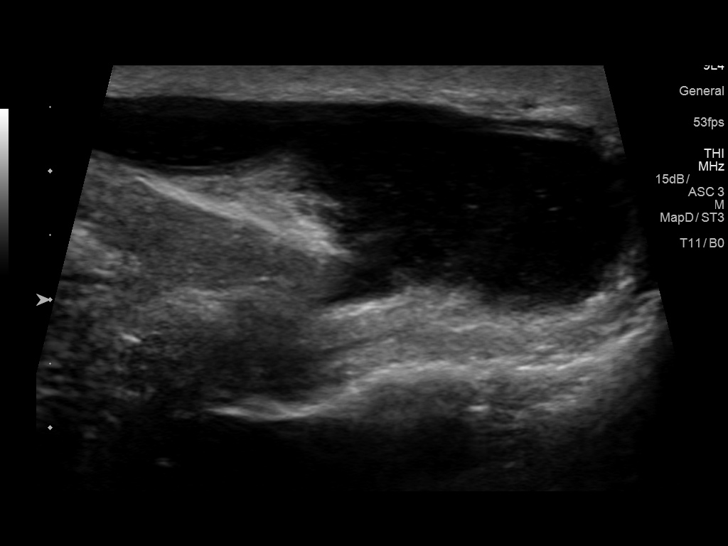
[im 50/119]
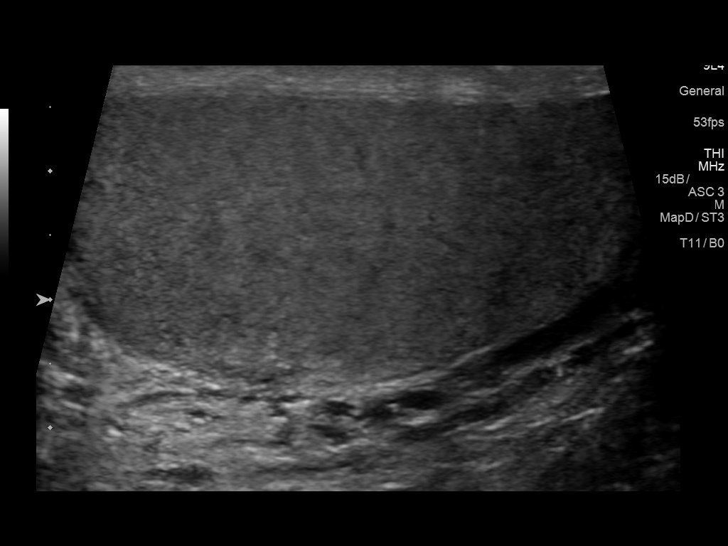
[im 60/119]
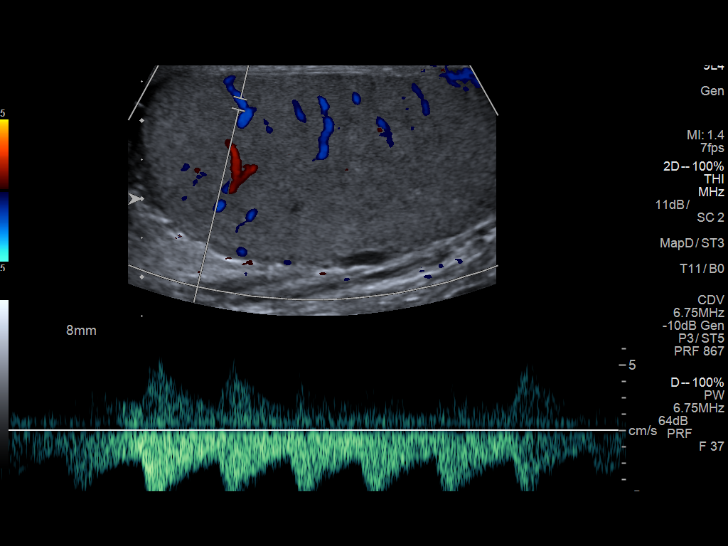
[im 69/119]
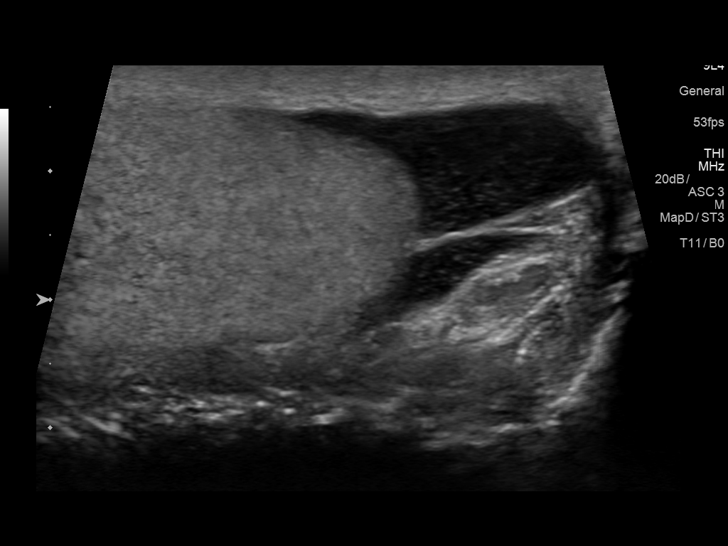
[im 79/119]
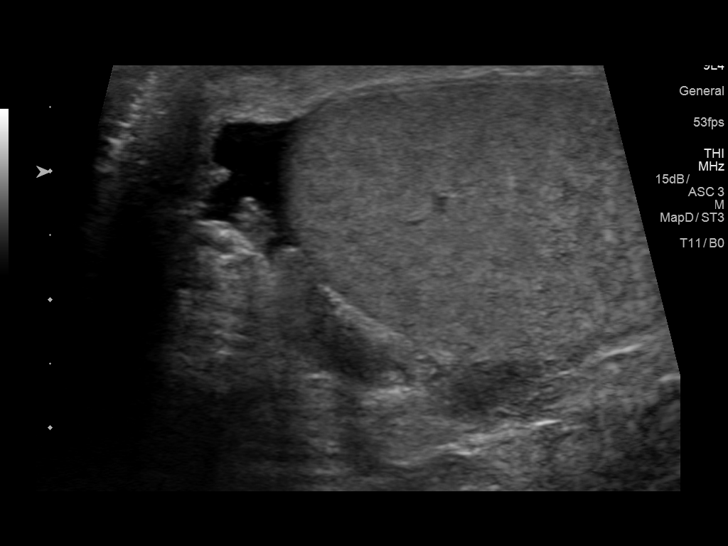
[im 89/119]
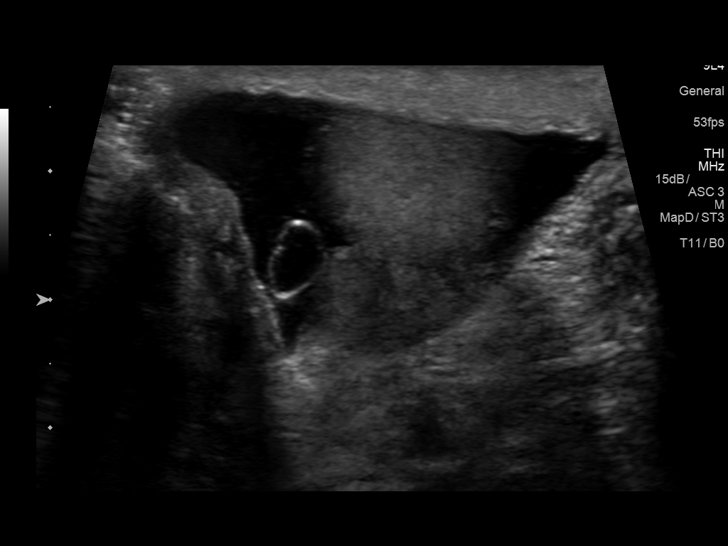
[im 99/119]
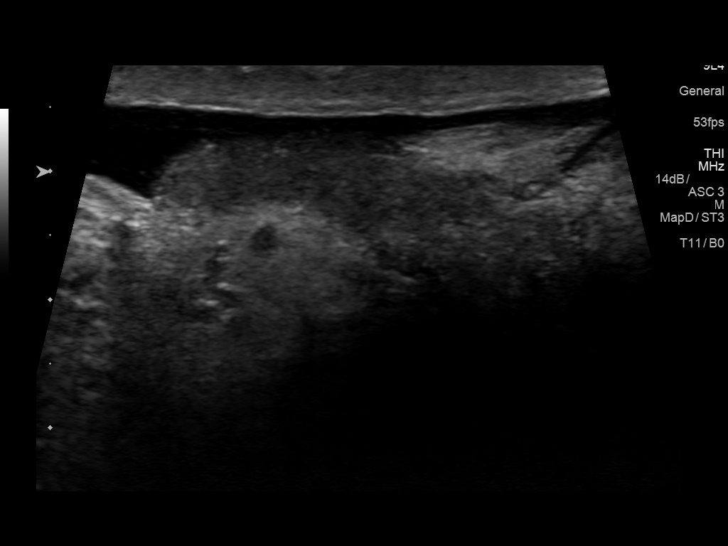
[im 109/119]
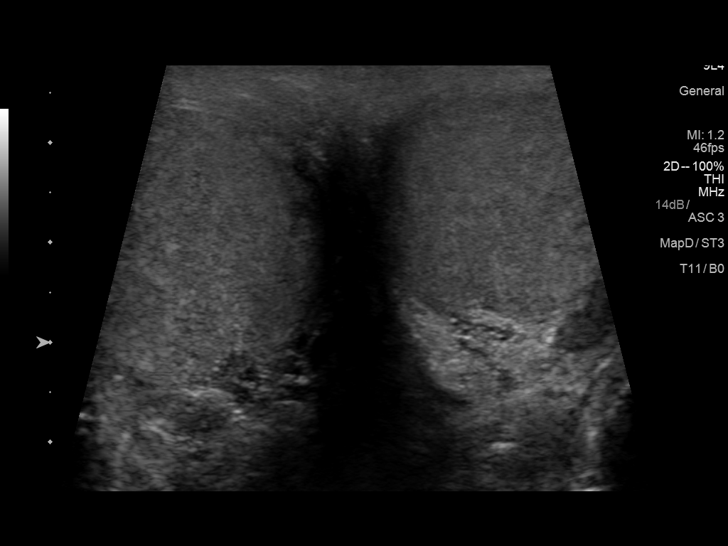
[im 119/119]
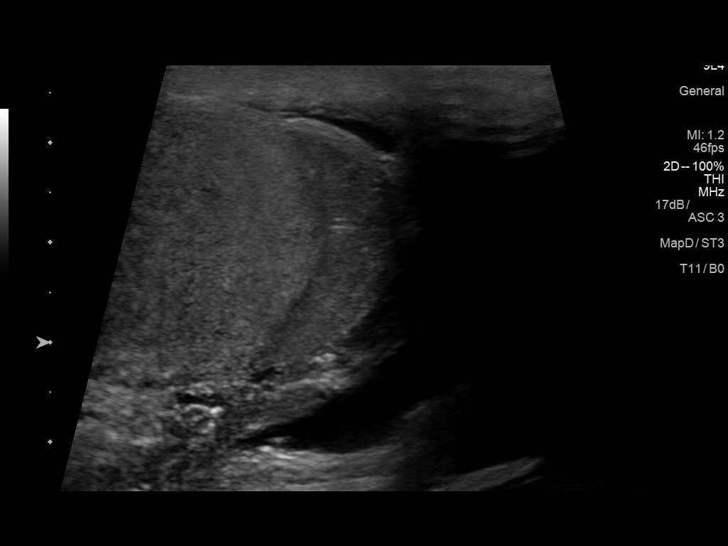

[13 of 25 positions shown; findings below may reference images not displayed]

FINDINGS: Measurements: 4.7 x 3.2 x 2.6 cm. The previously demonstrated 8 x 8
x 7 mm hypoechoic area in the inferior right testicle is less
well-defined and more wedge-shaped today. This measures
approximately 17 x 12 x 9 mm in maximum dimensions today. This has
internal blood flow with color Doppler, similar to the remainder of
the testicle.

Left testicle

Measurements: 4.7 x 2.8 x 2.7 cm. No mass or microlithiasis
visualized.

Right epididymis:  Stable 6 mm cyst.

Left epididymis:  Stable 5 mm cyst.

Hydrocele: Small right hydrocele with an interval decrease in size.
Moderate-sized left hydrocele with low-level internal echoes without
significant change.

Varicocele:  None visualized.

Pulsed Doppler interrogation of both testes demonstrates normal low
resistance arterial and venous waveforms bilaterally.
IMPRESSION: 1. Interval increase in size of the hypoechoic area in the inferior
right testicle, currently measuring 17 x 12 x 9 mm. This is
concerning for an enlarging infiltrative neoplasm such as lymphoma
or an infectious or focal inflammatory process.
2. Small right hydrocele with improvement.
3. Moderate-sized complicated left hydrocele without significant
change.
4. Stable small bilateral epididymal cysts.

## 2018-11-15 DIAGNOSIS — N448 Other noninflammatory disorders of the testis: Secondary | ICD-10-CM | POA: Diagnosis not present

## 2018-11-15 DIAGNOSIS — D4011 Neoplasm of uncertain behavior of right testis: Secondary | ICD-10-CM | POA: Diagnosis not present

## 2018-11-24 DIAGNOSIS — D4011 Neoplasm of uncertain behavior of right testis: Secondary | ICD-10-CM | POA: Diagnosis not present

## 2018-12-18 DIAGNOSIS — Z1211 Encounter for screening for malignant neoplasm of colon: Secondary | ICD-10-CM | POA: Diagnosis not present

## 2018-12-25 DIAGNOSIS — R1032 Left lower quadrant pain: Secondary | ICD-10-CM | POA: Diagnosis not present

## 2018-12-29 DIAGNOSIS — R030 Elevated blood-pressure reading, without diagnosis of hypertension: Secondary | ICD-10-CM | POA: Diagnosis not present

## 2018-12-29 DIAGNOSIS — K5792 Diverticulitis of intestine, part unspecified, without perforation or abscess without bleeding: Secondary | ICD-10-CM | POA: Diagnosis not present

## 2019-06-14 DIAGNOSIS — R93811 Abnormal radiologic findings on diagnostic imaging of right testicle: Secondary | ICD-10-CM | POA: Diagnosis not present

## 2019-06-27 DIAGNOSIS — M25562 Pain in left knee: Secondary | ICD-10-CM | POA: Diagnosis not present

## 2019-07-20 DIAGNOSIS — Z125 Encounter for screening for malignant neoplasm of prostate: Secondary | ICD-10-CM | POA: Diagnosis not present

## 2019-07-20 DIAGNOSIS — E785 Hyperlipidemia, unspecified: Secondary | ICD-10-CM | POA: Diagnosis not present

## 2019-07-20 DIAGNOSIS — Z5181 Encounter for therapeutic drug level monitoring: Secondary | ICD-10-CM | POA: Diagnosis not present

## 2019-07-20 DIAGNOSIS — Z0000 Encounter for general adult medical examination without abnormal findings: Secondary | ICD-10-CM | POA: Diagnosis not present

## 2019-07-20 DIAGNOSIS — M2550 Pain in unspecified joint: Secondary | ICD-10-CM | POA: Diagnosis not present

## 2019-09-05 ENCOUNTER — Other Ambulatory Visit: Payer: Self-pay

## 2019-09-05 DIAGNOSIS — Z20822 Contact with and (suspected) exposure to covid-19: Secondary | ICD-10-CM

## 2019-09-07 LAB — NOVEL CORONAVIRUS, NAA: SARS-CoV-2, NAA: NOT DETECTED

## 2019-11-23 DIAGNOSIS — R079 Chest pain, unspecified: Secondary | ICD-10-CM | POA: Diagnosis not present

## 2019-12-02 DIAGNOSIS — R079 Chest pain, unspecified: Secondary | ICD-10-CM | POA: Diagnosis not present

## 2019-12-16 NOTE — Progress Notes (Signed)
Cardiology Office Note:   Date:  12/18/2019  NAME:  Paul Clark    MRN: [id number] DOB:  Dec 24, 1960   PCP:  Iva Boop, MD  Cardiologist:  No primary care provider on file.   Referring MD: Shirlean Mylar, MD   Chief Complaint  Patient presents with  . Chest Pain   History of Present Illness:   Paul Clark is a 59 y.o. male with a hx of diverticulitis who is being seen today for the evaluation of chest pain at the request of Via, Caryn Bee, MD.  He reports for the last 2 to 3 weeks has had intermittent on and off left-sided stabbing chest pain.  The pain is described as 3 out of 10.  It occurs mainly in the morning hours lasting several seconds and then going away.  It can come back and then repeat again.  He reports that this will happen up until 11 AM.  He does not have it during the rest of the day.  No exertional component to it.  Does not appear to go away with any sort of intervention that he does.  No identifiable trigger.  He does report starting pravastatin in November.  He is recently stopped this medication and noticed improvement with the pain.  He reports he is concerned about his cholesterol level and the possibility of having heart disease.  He reports his youngest son died at the age of 77.  This apparently was sudden cardiac death.  He reports he had syncopal episodes and was evaluated by cardiologist but no firm diagnosis was made.  Apparently his son was found dead at home alone.  He is bothered by this he does wonder if there is some sort of genetic condition that he possibly has.  He reports given the chest pain in his son's history he is quite concerned.  Blood pressure a bit elevated today.  He reports has been slightly elevated over the past few months.  No medications currently.  He is continue to work on diet and exercise.  CVD risk factors include hyperlipidemia.  He is a never smoker.  No strong family history of heart disease other than the sudden death of his son.  Total  cholesterol 220, HDL 59, LDL 152, triglycerides 45, serum creatinine 0.9 4 in the morning few weeks ago continue urgent care which was part of  Past Medical History: Past Medical History:  Diagnosis Date  . Diverticulitis   . Hyperlipidemia     Past Surgical History: Past Surgical History:  Procedure Laterality Date  . KNEE SURGERY      Current Medications: Current Meds  Medication Sig  . ibuprofen (ADVIL,MOTRIN) 800 MG tablet Take 1 tablet (800 mg total) by mouth 3 (three) times daily.  . [DISCONTINUED] pravastatin (PRAVACHOL) 10 MG tablet Take 10 mg by mouth daily.  . [DISCONTINUED] Rosuvastatin Calcium (CRESTOR PO) Take by mouth.     Current Facility-Administered Medications for the 12/18/19 encounter (Office Visit) with Sande Rives, MD  Medication  . methylPREDNISolone acetate (DEPO-MEDROL) injection 40 mg     Allergies:    Patient has no known allergies.   Social History: Social History   Socioeconomic History  . Marital status: Married    Spouse name: Not on file  . Number of children: Not on file  . Years of education: BS  . Highest education level: Not on file  Occupational History  . Occupation: Firefighter  Tobacco Use  . Smoking status: Never  Smoker  . Smokeless tobacco: Never Used  Substance and Sexual Activity  . Alcohol use: No    Comment: 3 drinks a week  . Drug use: No  . Sexual activity: Not on file  Other Topics Concern  . Not on file  Social History Narrative  . Not on file   Social Determinants of Health   Financial Resource Strain:   . Difficulty of Paying Living Expenses: Not on file  Food Insecurity:   . Worried About Programme researcher, broadcasting/film/video in the Last Year: Not on file  . Ran Out of Food in the Last Year: Not on file  Transportation Needs:   . Lack of Transportation (Medical): Not on file  . Lack of Transportation (Non-Medical): Not on file  Physical Activity:   . Days of Exercise per Week: Not on file  . Minutes of  Exercise per Session: Not on file  Stress:   . Feeling of Stress : Not on file  Social Connections:   . Frequency of Communication with Friends and Family: Not on file  . Frequency of Social Gatherings with Friends and Family: Not on file  . Attends Religious Services: Not on file  . Active Member of Clubs or Organizations: Not on file  . Attends Banker Meetings: Not on file  . Marital Status: Not on file     Family History: The patient's family history includes Cancer in his mother and another family member; Dementia in his mother; Diabetes in an other family member; Heart disease in his child; Hyperlipidemia in his father and mother; Hypertension in his father.  ROS:   All other ROS reviewed and negative. Pertinent positives noted in the HPI.     EKGs/Labs/Other Studies Reviewed:   The following studies were personally reviewed by me today:  EKG:  EKG is  ordered today.  The ekg ordered today demonstrates normal sinus rhythm, heart rate 72, no acute ST-T changes, no evidence of prior infarction, normal EKG, and was personally reviewed by me.   Recent Labs: No results found for requested labs within last 8760 hours.   Recent Lipid Panel No results found for: CHOL, TRIG, HDL, CHOLHDL, VLDL, LDLCALC, LDLDIRECT  Physical Exam:   VS:  BP (!) 153/69   Pulse 72   Ht 5\' 8"  (1.727 m)   Wt 177 lb (80.3 kg)   SpO2 97%   BMI 26.91 kg/m    Wt Readings from Last 3 Encounters:  12/18/19 177 lb (80.3 kg)  06/01/16 169 lb (76.7 kg)  05/09/16 168 lb (76.2 kg)    General: Well nourished, well developed, in no acute distress Heart: Atraumatic, normal size  Eyes: PEERLA, EOMI  Neck: Supple, no JVD Endocrine: No thryomegaly Cardiac: Normal S1, S2; RRR; no murmurs, rubs, or gallops Lungs: Clear to auscultation bilaterally, no wheezing, rhonchi or rales  Abd: Soft, nontender, no hepatomegaly  Ext: No edema, pulses 2+ Musculoskeletal: No deformities, BUE and BLE strength  normal and equal Skin: Warm and dry, no rashes   Neuro: Alert and oriented to person, place, time, and situation, CNII-XII grossly intact, no focal deficits  Psych: Normal mood and affect   ASSESSMENT:   Paul Clark is a 58 y.o. male who presents for the following: 1. Chest pain, unspecified type   2. Mixed hyperlipidemia   3. Precordial pain     PLAN:   1. Chest pain, unspecified type -Atypical chest pain.  This could be related to the recent pravastatin  versus underlying CAD.  Given his son's history of sudden cardiac death I think it is prudent to proceed with a cardiac work-up.  We will proceed with a cardiac CTA.  He will get kidney panel 1 week before his test.  He also will take 100 mg of metoprolol tartrate 2 hours before scan.  We will also obtain an echocardiogram to exclude structural heart disease.  His son's history of sudden death is alarming.  I really do not understand what exactly happened.  He reports the medical examiner told him it was related to an arrhythmia.  Nonetheless, we will make sure everything is normal on his ultrasound and coronary CTA.  2. Mixed hyperlipidemia -Elevated and likely will require treatment.  For now we will hold until the above work-up is done.  We will then intensify therapy based on the results of his cardiac CTA  3. Precordial pain -Cardiac CTA and echo as above  Disposition: Return in about 2 months (around 02/15/2020).  Medication Adjustments/Labs and Tests Ordered: Current medicines are reviewed at length with the patient today.  Concerns regarding medicines are outlined above.  Orders Placed This Encounter  Procedures  . CT CORONARY MORPH W/CTA COR W/SCORE W/CA W/CM &/OR WO/CM  . CT CORONARY FRACTIONAL FLOW RESERVE DATA PREP  . CT CORONARY FRACTIONAL FLOW RESERVE FLUID ANALYSIS  . Basic metabolic panel  . EKG 12-Lead  . ECHOCARDIOGRAM COMPLETE   Meds ordered this encounter  Medications  . metoprolol tartrate (LOPRESSOR) 100 MG  tablet    Sig: TAKE 100MG  2 HOURS PRIOR TO CORONARY CT    Dispense:  1 tablet    Refill:  0    Patient Instructions  Medication Instructions:  CONTINUE WITH SAME MEDICATIONS *If you need a refill on your cardiac medications before your next appointment, please call your pharmacy*  Lab Work: BMET TO BE DONE ONE WEEK PRIOR TO CORONARY CT. LAB ORDER ENCLOSED.  Testing/Procedures: SCHEDULE CORONARY CT. SEE BELOW INSTRUCTIONS  Your physician has requested that you have an echocardiogram. Echocardiography is a painless test that uses sound waves to create images of your heart. It provides your doctor with information about the size and shape of your heart and how well your heart's chambers and valves are working. This procedure takes approximately one hour. There are no restrictions for this procedure.  [address]     Follow-Up: At [facility], you and your health needs are our priority.  As part of our continuing mission to provide you with exceptional heart care, we have created designated Provider Care Teams.  These Care Teams include your primary Cardiologist (physician) and Advanced Practice Providers (APPs -  Physician Assistants and Nurse Practitioners) who all work together to provide you with the care you need, when you need it.  Your next appointment:   2 month(s)  The format for your next appointment:   Virtual Visit   Provider:   [facility], MD   INSTRUCTIONS:  Your cardiac CT will be scheduled at one of the below locations:   [facility] [address] [place], [place] [phone]  OR  [facility] [address] [place], [place] [phone]  If scheduled at [facility], please arrive at the [facility] main entrance of [facility] 30-45 minutes prior to test start time. Proceed to the [facility] Radiology Department (first floor) to check-in and  test prep.  If scheduled at  [facility], please arrive 15 mins early for check-in and test prep.  Please follow these instructions carefully (unless otherwise directed):  Hold all erectile dysfunction medications at least 3 days (72 hrs) prior to test.  On the Night Before the Test: . Be sure to Drink plenty of water. . Do not consume any caffeinated/decaffeinated beverages or chocolate 12 hours prior to your test. . Do not take any antihistamines 12 hours prior to your test.   On the Day of the Test: . Drink plenty of water. Do not drink any water within one hour of the test. . Do not eat any food 4 hours prior to the test. . You may take your regular medications prior to the test.  . Take metoprolol 100 mg two hours prior to test.         After the Test: . Drink plenty of water. . After receiving IV contrast, you may experience a mild flushed feeling. This is normal. . On occasion, you may experience a mild rash up to 24 hours after the test. This is not dangerous. If this occurs, you can take Benadryl 25 mg and increase your fluid intake. . If you experience trouble breathing, this can be serious. If it is severe call 911 IMMEDIATELY. If it is mild, please call our office.   Once we have confirmed authorization from your insurance company, we will call you to set up a date and time for your test.   For non-scheduling related questions, please contact the cardiac imaging nurse navigator should you have any questions/concerns: Marchia Bond, RN Navigator Cardiac Imaging [facility] [phone] Office        Signed, Addison Naegeli. Audie Box, [place]  [address], [place], [place] [address] [phone]  12/18/2019 5:41 PM

## 2019-12-18 ENCOUNTER — Ambulatory Visit: Payer: BC Managed Care – PPO | Admitting: Cardiology

## 2019-12-18 ENCOUNTER — Other Ambulatory Visit: Payer: Self-pay

## 2019-12-18 ENCOUNTER — Ambulatory Visit (INDEPENDENT_AMBULATORY_CARE_PROVIDER_SITE_OTHER): Payer: BC Managed Care – PPO | Admitting: Cardiovascular Disease

## 2019-12-18 ENCOUNTER — Encounter: Payer: Self-pay | Admitting: Cardiovascular Disease

## 2019-12-18 VITALS — BP 153/69 | HR 72 | Ht 68.0 in | Wt 177.0 lb

## 2019-12-18 DIAGNOSIS — R079 Chest pain, unspecified: Secondary | ICD-10-CM | POA: Diagnosis not present

## 2019-12-18 DIAGNOSIS — R072 Precordial pain: Secondary | ICD-10-CM | POA: Diagnosis not present

## 2019-12-18 DIAGNOSIS — E782 Mixed hyperlipidemia: Secondary | ICD-10-CM | POA: Diagnosis not present

## 2019-12-18 MED ORDER — METOPROLOL TARTRATE 100 MG PO TABS: ORAL_TABLET | ORAL | 0 refills | Status: DC

## 2019-12-18 NOTE — Patient Instructions (Addendum)
Medication Instructions:  CONTINUE WITH SAME MEDICATIONS *If you need a refill on your cardiac medications before your next appointment, please call your pharmacy*  Lab Work: BMET TO BE DONE ONE WEEK PRIOR TO CORONARY CT. LAB ORDER ENCLOSED.  Testing/Procedures: SCHEDULE CORONARY CT. SEE BELOW INSTRUCTIONS  Your physician has requested that you have an echocardiogram. Echocardiography is a painless test that uses sound waves to create images of your heart. It provides your doctor with information about the size and shape of your heart and how well your heart's chambers and valves are working. This procedure takes approximately one hour. There are no restrictions for this procedure.  [address]     Follow-Up: At [facility], you and your health needs are our priority.  As part of our continuing mission to provide you with exceptional heart care, we have created designated Provider Care Teams.  These Care Teams include your primary Cardiologist (physician) and Advanced Practice Providers (APPs -  Physician Assistants and Nurse Practitioners) who all work together to provide you with the care you need, when you need it.  Your next appointment:   2 month(s)  The format for your next appointment:   Virtual Visit   Provider:   Lennie Odor, MD   INSTRUCTIONS:  Your cardiac CT will be scheduled at one of the below locations:   [facility] [address] [place], [place] [address] [phone]  OR  [facility] [address] [place], [place] [address] [phone]  If scheduled at [facility], please arrive at the [facility] main entrance of [facility] 30-45 minutes prior to test start time. Proceed to the [facility] Radiology Department (first floor) to check-in and test prep.  If scheduled at [facility], please arrive 15 mins early for check-in and test  prep.  Please follow these instructions carefully (unless otherwise directed):  Hold all erectile dysfunction medications at least 3 days (72 hrs) prior to test.  On the Night Before the Test: . Be sure to Drink plenty of water. . Do not consume any caffeinated/decaffeinated beverages or chocolate 12 hours prior to your test. . Do not take any antihistamines 12 hours prior to your test.   On the Day of the Test: . Drink plenty of water. Do not drink any water within one hour of the test. . Do not eat any food 4 hours prior to the test. . You may take your regular medications prior to the test.  . Take metoprolol 100 mg two hours prior to test.         After the Test: . Drink plenty of water. . After receiving IV contrast, you may experience a mild flushed feeling. This is normal. . On occasion, you may experience a mild rash up to 24 hours after the test. This is not dangerous. If this occurs, you can take Benadryl 25 mg and increase your fluid intake. . If you experience trouble breathing, this can be serious. If it is severe call 911 IMMEDIATELY. If it is mild, please call our office.   Once we have confirmed authorization from your insurance company, we will call you to set up a date and time for your test.   For non-scheduling related questions, please contact the cardiac imaging nurse navigator should you have any questions/concerns: Rockwell Alexandria, RN Navigator Cardiac Imaging [facility] [phone] Office

## 2019-12-28 ENCOUNTER — Ambulatory Visit (HOSPITAL_COMMUNITY): Payer: BC Managed Care – PPO | Attending: Cardiology

## 2019-12-28 ENCOUNTER — Other Ambulatory Visit: Payer: Self-pay

## 2019-12-28 DIAGNOSIS — E782 Mixed hyperlipidemia: Secondary | ICD-10-CM | POA: Diagnosis not present

## 2019-12-28 DIAGNOSIS — R072 Precordial pain: Secondary | ICD-10-CM | POA: Diagnosis not present

## 2019-12-28 DIAGNOSIS — R079 Chest pain, unspecified: Secondary | ICD-10-CM

## 2020-01-18 DIAGNOSIS — E782 Mixed hyperlipidemia: Secondary | ICD-10-CM | POA: Diagnosis not present

## 2020-01-18 DIAGNOSIS — R072 Precordial pain: Secondary | ICD-10-CM | POA: Diagnosis not present

## 2020-01-18 DIAGNOSIS — R079 Chest pain, unspecified: Secondary | ICD-10-CM | POA: Diagnosis not present

## 2020-01-23 ENCOUNTER — Encounter (HOSPITAL_COMMUNITY): Payer: Self-pay

## 2020-01-23 LAB — BASIC METABOLIC PANEL
BUN/Creatinine Ratio: 15 (ref 9–20)
BUN: 17 mg/dL (ref 6–24)
CO2: 25 mmol/L (ref 20–29)
Calcium: 9.3 mg/dL (ref 8.7–10.2)
Chloride: 102 mmol/L (ref 96–106)
Creatinine, Ser: 1.1 mg/dL (ref 0.76–1.27)
GFR calc Af Amer: 86 mL/min/{1.73_m2} (ref >59)
GFR calc non Af Amer: 74 mL/min/{1.73_m2} (ref >59)
Glucose: 93 mg/dL (ref 65–99)
Potassium: 4.8 mmol/L (ref 3.5–5.2)

## 2020-01-24 ENCOUNTER — Telehealth (HOSPITAL_COMMUNITY): Payer: Self-pay | Admitting: Emergency Medicine

## 2020-01-24 NOTE — Telephone Encounter (Signed)
Left message on voicemail with name and callback number Arney Mayabb RN Navigator Cardiac Imaging [facility] [phone] Office [phone] Cell  

## 2020-01-25 ENCOUNTER — Ambulatory Visit (HOSPITAL_COMMUNITY)
Admission: RE | Admit: 2020-01-25 | Discharge: 2020-01-25 | Disposition: A | Discharge status: Home / Self Care | Payer: BC Managed Care – PPO | Source: Ambulatory Visit | Attending: Cardiology | Admitting: Cardiology

## 2020-01-25 ENCOUNTER — Other Ambulatory Visit: Payer: Self-pay

## 2020-01-25 DIAGNOSIS — R079 Chest pain, unspecified: Secondary | ICD-10-CM | POA: Insufficient documentation

## 2020-01-25 DIAGNOSIS — R072 Precordial pain: Secondary | ICD-10-CM | POA: Diagnosis not present

## 2020-01-25 DIAGNOSIS — E782 Mixed hyperlipidemia: Secondary | ICD-10-CM | POA: Insufficient documentation

## 2020-01-25 MED ORDER — NITROGLYCERIN 0.4 MG SL SUBL
SUBLINGUAL_TABLET | SUBLINGUAL | Status: AC
  Filled 2020-01-25: qty 2

## 2020-01-25 MED ORDER — SODIUM CHLORIDE 0.9 % IV BOLUS
500.0000 mL | Freq: Once | INTRAVENOUS | Status: AC
  Administered 2020-01-25: 500 mL via INTRAVENOUS

## 2020-01-25 MED ORDER — IOHEXOL 350 MG/ML SOLN
80.0000 mL | Freq: Once | INTRAVENOUS | Status: AC | PRN
  Administered 2020-01-25: 80 mL via INTRAVENOUS

## 2020-01-25 MED ORDER — NITROGLYCERIN 0.4 MG SL SUBL
0.8000 mg | SUBLINGUAL_TABLET | Freq: Once | SUBLINGUAL | Status: AC
  Administered 2020-01-25: 0.8 mg via SUBLINGUAL

## 2020-01-25 NOTE — Progress Notes (Signed)
Patient discharged walking, feels fine, clammy feeling gone, no lightheadedness even with standing. Will be driven home by wife

## 2020-01-25 NOTE — Progress Notes (Signed)
Dr Flora Lipps in to see patient and let him know his test results were OK, one narrowed vessel that did not need treatment. SBP now 94 with NS bolus infusing

## 2020-01-25 NOTE — Progress Notes (Signed)
Post CTA of heart test (Metoporolol 100mg  @ 6:15am and nitro 0.8mg  pre test) BP kept falling and now 82/50, heart rate stable in the 50's. Dr aware and NS 500cc bolus infusing now. Has had a cup of coffee and water. Just feels clammy, not light headed. Chair was tilted back.

## 2020-01-29 ENCOUNTER — Encounter: Payer: Self-pay | Admitting: Cardiovascular Disease

## 2020-01-29 ENCOUNTER — Telehealth (INDEPENDENT_AMBULATORY_CARE_PROVIDER_SITE_OTHER): Payer: BC Managed Care – PPO | Admitting: Cardiovascular Disease

## 2020-01-29 VITALS — BP 124/69 | HR 67 | Ht 68.0 in | Wt 170.0 lb

## 2020-01-29 DIAGNOSIS — M940 Chondrocostal junction syndrome [Tietze]: Secondary | ICD-10-CM

## 2020-01-29 DIAGNOSIS — E782 Mixed hyperlipidemia: Secondary | ICD-10-CM | POA: Diagnosis not present

## 2020-01-29 DIAGNOSIS — I2510 Atherosclerotic heart disease of native coronary artery without angina pectoris: Secondary | ICD-10-CM | POA: Diagnosis not present

## 2020-01-29 MED ORDER — ROSUVASTATIN CALCIUM 10 MG PO TABS: 10.0000 mg | ORAL_TABLET | Freq: Every day | ORAL | 3 refills | Status: DC

## 2020-01-29 MED ORDER — ASPIRIN EC 81 MG PO TBEC: 81.0000 mg | DELAYED_RELEASE_TABLET | Freq: Every day | ORAL | 3 refills | Status: AC

## 2020-01-29 MED ORDER — IBUPROFEN 800 MG PO TABS: 800.0000 mg | ORAL_TABLET | Freq: Three times a day (TID) | ORAL | 0 refills | Status: DC

## 2020-01-29 NOTE — Patient Instructions (Addendum)
Medication Instructions:  Take Ibuprofen 800 mg three times daily for 10 days  Take Crestor 10 mg daily  Take Aspirin 81 mg daily (over the counter)   *If you need a refill on your cardiac medications before your next appointment, please call your pharmacy*  Lab Work: Fasting lipid panel 1 week before follow up appointment If you have labs (blood work) drawn today and your tests are completely normal, you will receive your results only by: Marland Kitchen MyChart Message (if you have MyChart) OR . A paper copy in the mail If you have any lab test that is abnormal or we need to change your treatment, we will call you to review the results.  Follow-Up: At [facility], you and your health needs are our priority.  As part of our continuing mission to provide you with exceptional heart care, we have created designated Provider Care Teams.  These Care Teams include your primary Cardiologist (physician) and Advanced Practice Providers (APPs -  Physician Assistants and Nurse Practitioners) who all work together to provide you with the care you need, when you need it.  Your next appointment:   2 month(s)  The format for your next appointment:   In Person  Provider:   Lennie Odor, MD

## 2020-01-29 NOTE — Progress Notes (Signed)
Virtual Visit via Video Note   This visit type was conducted due to national recommendations for restrictions regarding the COVID-19 Pandemic (e.g. social distancing) in an effort to limit this patient's exposure and mitigate transmission in our community.  Due to his co-morbid illnesses, this patient is at least at moderate risk for complications without adequate follow up.  This format is felt to be most appropriate for this patient at this time.  All issues noted in this document were discussed and addressed.  A limited physical exam was performed with this format.  Please refer to the patient's chart for his consent to telehealth for [facility].   Date:  01/29/2020   ID:  Paul Clark, DOB 07-Jun-1961, MRN [id number]  Patient Location: Home Provider Location: Home  PCP:  ViaLennette Bihari, MD  Cardiologist:  Evalina Field, MD   Evaluation Performed:  Follow-Up Visit  Chief Complaint:  CAD  History of Present Illness:    Paul Clark is a 59 y.o. male with HLD who presents for follow-up of CAD. Recent CCTA with non-obstructive CAD. Still get sharp L sided chest pain. Has noticed improvement with sleeping on his side. Symptoms worse with lying on his back. He reports since changing his sleeping position, he has been pain free for the past 2 days. He reports he is relieved his heart is ok. Has had issues with pravastatin in the past. Has tried lipitor but unsure of crestor. No issues with trying this. Need to get his LDL a bit lower. Reports no SOB, palpitations, or other symptoms today.   Problem List 1. Non-obstructive CAD -CCTA 01/25/20 with mild LAD (25-49%) 2. HLD -T chol 220 HDL 59 LDL 152 TG 45  The patient does not have symptoms concerning for COVID-19 infection (fever, chills, cough, or new shortness of breath).    Past Medical History:  Diagnosis Date  . Diverticulitis   . Hyperlipidemia    Past Surgical History:  Procedure Laterality Date  . KNEE SURGERY       No  outpatient medications have been marked as taking for the 01/29/20 encounter (Telemedicine) with O'Neal, Cassie Freer, MD.   Current Facility-Administered Medications for the 01/29/20 encounter (Telemedicine) with O'Neal, Cassie Freer, MD  Medication  . methylPREDNISolone acetate (DEPO-MEDROL) injection 40 mg     Allergies:   Patient has no known allergies.   Social History   Tobacco Use  . Smoking status: Never Smoker  . Smokeless tobacco: Never Used  Substance Use Topics  . Alcohol use: No    Comment: 3 drinks a week  . Drug use: No     Family Hx: The patient's family history includes Cancer in his mother and another family member; Dementia in his mother; Diabetes in an other family member; Heart disease in his child; Hyperlipidemia in his father and mother; Hypertension in his father.  ROS:   Please see the history of present illness.     All other systems reviewed and are negative.   Prior CV studies:   The following studies were reviewed today:  TTE 12/28/2019 1. Left ventricular ejection fraction, by visual estimation, is 55 to  60%. The left ventricle has normal function. There is no left ventricular  hypertrophy.  2. Left ventricular diastolic parameters are consistent with Grade I  diastolic dysfunction (impaired relaxation).  3. The left ventricle has no regional wall motion abnormalities.  4. Global right ventricle has normal systolic function.The right  ventricular size is normal.  5. Left atrial size was normal.  6. Right atrial size was normal.  7. The mitral valve is normal in structure. No evidence of mitral valve  regurgitation. No evidence of mitral stenosis.  8. The tricuspid valve is normal in structure.  9. The tricuspid valve is normal in structure. Tricuspid valve  regurgitation is trivial.  10. The aortic valve is tricuspid. Aortic valve regurgitation is trivial.  Mild aortic valve sclerosis without stenosis.  11. The pulmonic valve  was normal in structure. Pulmonic valve  regurgitation is trivial.  12. The inferior vena cava is normal in size with greater than 50%  respiratory variability, suggesting right atrial pressure of 3 mmHg.  13. Normal LV systolic function; grade 1 diastolic dysfunction; trace AI.  CCTA 01/25/2020 1. Coronary calcium score of 20. This was 82nd percentile for age and sex matched control.  2. Normal coronary origin with right dominance.  3. Mild, non-obstructive CAD in the mid LAD (25-49%).  4. Minimal, non-obstructive CAD in the LCX/RCA (<25%).  RECOMMENDATIONS: 1. Mild non-obstructive CAD (25-49%). Consider non-atherosclerotic causes of chest pain. Consider preventive therapy and risk factor modification.  Labs/Other Tests and Data Reviewed:    EKG:  No ECG reviewed.  Recent Labs: 01/18/2020: BUN 17; Creatinine, Ser 1.10; Potassium 4.8; Sodium CANCELED   Recent Lipid Panel No results found for: CHOL, TRIG, HDL, CHOLHDL, LDLCALC, LDLDIRECT  Wt Readings from Last 3 Encounters:  01/29/20 170 lb (77.1 kg)  12/18/19 177 lb (80.3 kg)  06/01/16 169 lb (76.7 kg)     Objective:    Vital Signs:  BP 124/69   Pulse 67   Ht 5\' 8"  (1.727 m)   Wt 170 lb (77.1 kg)   BMI 25.85 kg/m   VITAL SIGNS:  reviewed  Gen: NAD Pulm: talking full sentences, no SOB Pscyh: normal mood/affect   ASSESSMENT & PLAN:    1. Coronary artery disease involving native coronary artery of native heart without angina pectoris -nonobstructive CAD on CCTA -will start ASA and crestor 10 mg QHS -in office appointment in 2 months and will check lipid profile 1 week before  2. Mixed hyperlipidemia -goal LDL <70 -start crestor as above   3. Costochondritis -sharp positional L sided chest pain -better with lying on side vs lying flat -sharp and atypical in nature -EKG not consistent with pericarditis and history not either -will plan to try ibuprofen 800 mg TID for 10 days to see if this helps, he will  let know how this goes  -we may consider pericarditis treatment if fails NSAID   COVID-19 Education: The signs and symptoms of COVID-19 were discussed with the patient and how to seek care for testing (follow up with PCP or arrange E-visit).  The importance of social distancing was discussed today.  Time:   Today, I have spent 35 minutes with the patient with telehealth technology discussing the above problems.    Medication Adjustments/Labs and Tests Ordered: Current medicines are reviewed at length with the patient today.  Concerns regarding medicines are outlined above.   Tests Ordered: No orders of the defined types were placed in this encounter.  Medication Changes: Meds ordered this encounter  Medications  . ibuprofen (ADVIL) 800 MG tablet    Sig: Take 1 tablet (800 mg total) by mouth 3 (three) times daily. For 10 days    Dispense:  30 tablet    Refill:  0  . rosuvastatin (CRESTOR) 10 MG tablet    Sig: Take 1  tablet (10 mg total) by mouth daily.    Dispense:  90 tablet    Refill:  3  . aspirin EC 81 MG tablet    Sig: Take 1 tablet (81 mg total) by mouth daily.    Dispense:  90 tablet    Refill:  3   Follow Up:  In Person in 2 month(s)  Signed, Reatha Harps, MD  01/29/2020 3:25 PM    [facility]

## 2020-02-15 ENCOUNTER — Telehealth: Payer: BC Managed Care – PPO | Admitting: Cardiovascular Disease

## 2020-05-06 ENCOUNTER — Other Ambulatory Visit: Payer: BC Managed Care – PPO

## 2020-05-06 DIAGNOSIS — Z20822 Contact with and (suspected) exposure to covid-19: Secondary | ICD-10-CM | POA: Diagnosis not present

## 2020-05-06 DIAGNOSIS — Z03818 Encounter for observation for suspected exposure to other biological agents ruled out: Secondary | ICD-10-CM | POA: Diagnosis not present

## 2020-05-15 DIAGNOSIS — Z1211 Encounter for screening for malignant neoplasm of colon: Secondary | ICD-10-CM | POA: Diagnosis not present

## 2020-05-15 DIAGNOSIS — K5730 Diverticulosis of large intestine without perforation or abscess without bleeding: Secondary | ICD-10-CM | POA: Diagnosis not present

## 2020-06-12 DIAGNOSIS — D1801 Hemangioma of skin and subcutaneous tissue: Secondary | ICD-10-CM | POA: Diagnosis not present

## 2020-06-12 DIAGNOSIS — L821 Other seborrheic keratosis: Secondary | ICD-10-CM | POA: Diagnosis not present

## 2020-06-12 DIAGNOSIS — D225 Melanocytic nevi of trunk: Secondary | ICD-10-CM | POA: Diagnosis not present

## 2020-06-12 DIAGNOSIS — D2261 Melanocytic nevi of right upper limb, including shoulder: Secondary | ICD-10-CM | POA: Diagnosis not present

## 2020-09-05 DIAGNOSIS — M7918 Myalgia, other site: Secondary | ICD-10-CM | POA: Diagnosis not present

## 2020-09-05 DIAGNOSIS — G4700 Insomnia, unspecified: Secondary | ICD-10-CM | POA: Diagnosis not present

## 2020-09-05 DIAGNOSIS — M549 Dorsalgia, unspecified: Secondary | ICD-10-CM | POA: Diagnosis not present

## 2020-10-06 ENCOUNTER — Ambulatory Visit: Payer: BC Managed Care – PPO | Admitting: Physical Therapy

## 2020-10-24 ENCOUNTER — Ambulatory Visit: Payer: BC Managed Care – PPO | Admitting: Physical Therapy

## 2020-11-07 ENCOUNTER — Ambulatory Visit: Payer: BC Managed Care – PPO | Admitting: Physical Therapy

## 2020-12-10 DIAGNOSIS — E785 Hyperlipidemia, unspecified: Secondary | ICD-10-CM | POA: Diagnosis not present

## 2020-12-10 DIAGNOSIS — Z125 Encounter for screening for malignant neoplasm of prostate: Secondary | ICD-10-CM | POA: Diagnosis not present

## 2020-12-10 DIAGNOSIS — Z5181 Encounter for therapeutic drug level monitoring: Secondary | ICD-10-CM | POA: Diagnosis not present

## 2020-12-17 DIAGNOSIS — Z0000 Encounter for general adult medical examination without abnormal findings: Secondary | ICD-10-CM | POA: Diagnosis not present

## 2020-12-17 DIAGNOSIS — Z5181 Encounter for therapeutic drug level monitoring: Secondary | ICD-10-CM | POA: Diagnosis not present

## 2020-12-17 DIAGNOSIS — Z13220 Encounter for screening for lipoid disorders: Secondary | ICD-10-CM | POA: Diagnosis not present

## 2021-02-16 ENCOUNTER — Other Ambulatory Visit: Payer: Self-pay | Admitting: Cardiovascular Disease

## 2021-03-19 ENCOUNTER — Other Ambulatory Visit: Payer: Self-pay | Admitting: Cardiovascular Disease

## 2021-05-08 ENCOUNTER — Other Ambulatory Visit: Payer: Self-pay | Admitting: Cardiovascular Disease

## 2022-02-10 DIAGNOSIS — Z125 Encounter for screening for malignant neoplasm of prostate: Secondary | ICD-10-CM | POA: Diagnosis not present

## 2022-02-10 DIAGNOSIS — F5101 Primary insomnia: Secondary | ICD-10-CM | POA: Diagnosis not present

## 2022-02-10 DIAGNOSIS — Z0000 Encounter for general adult medical examination without abnormal findings: Secondary | ICD-10-CM | POA: Diagnosis not present

## 2022-02-10 DIAGNOSIS — Z23 Encounter for immunization: Secondary | ICD-10-CM | POA: Diagnosis not present

## 2022-02-10 DIAGNOSIS — N401 Enlarged prostate with lower urinary tract symptoms: Secondary | ICD-10-CM | POA: Diagnosis not present

## 2022-02-10 DIAGNOSIS — Z131 Encounter for screening for diabetes mellitus: Secondary | ICD-10-CM | POA: Diagnosis not present

## 2022-02-10 DIAGNOSIS — E7800 Pure hypercholesterolemia, unspecified: Secondary | ICD-10-CM | POA: Diagnosis not present

## 2022-06-29 DIAGNOSIS — U071 COVID-19: Secondary | ICD-10-CM | POA: Diagnosis not present

## 2022-11-16 DIAGNOSIS — L821 Other seborrheic keratosis: Secondary | ICD-10-CM | POA: Diagnosis not present

## 2022-11-16 DIAGNOSIS — D2261 Melanocytic nevi of right upper limb, including shoulder: Secondary | ICD-10-CM | POA: Diagnosis not present

## 2022-11-16 DIAGNOSIS — D225 Melanocytic nevi of trunk: Secondary | ICD-10-CM | POA: Diagnosis not present

## 2022-11-16 DIAGNOSIS — L570 Actinic keratosis: Secondary | ICD-10-CM | POA: Diagnosis not present

## 2022-11-16 DIAGNOSIS — D2262 Melanocytic nevi of left upper limb, including shoulder: Secondary | ICD-10-CM | POA: Diagnosis not present

## 2022-11-19 ENCOUNTER — Other Ambulatory Visit (HOSPITAL_COMMUNITY): Payer: Self-pay | Admitting: Family Medicine

## 2022-11-19 DIAGNOSIS — E785 Hyperlipidemia, unspecified: Secondary | ICD-10-CM

## 2022-12-15 ENCOUNTER — Ambulatory Visit (HOSPITAL_COMMUNITY)
Admission: RE | Admit: 2022-12-15 | Discharge: 2022-12-15 | Disposition: A | Discharge status: Home / Self Care | Payer: BC Managed Care – PPO | Source: Ambulatory Visit | Attending: Family Medicine | Admitting: Family Medicine

## 2022-12-15 DIAGNOSIS — E785 Hyperlipidemia, unspecified: Secondary | ICD-10-CM | POA: Insufficient documentation

## 2023-02-21 DIAGNOSIS — E7800 Pure hypercholesterolemia, unspecified: Secondary | ICD-10-CM | POA: Diagnosis not present

## 2023-02-21 DIAGNOSIS — Z0000 Encounter for general adult medical examination without abnormal findings: Secondary | ICD-10-CM | POA: Diagnosis not present

## 2023-02-21 DIAGNOSIS — Z125 Encounter for screening for malignant neoplasm of prostate: Secondary | ICD-10-CM | POA: Diagnosis not present

## 2023-02-21 DIAGNOSIS — Z131 Encounter for screening for diabetes mellitus: Secondary | ICD-10-CM | POA: Diagnosis not present

## 2023-06-03 ENCOUNTER — Emergency Department (HOSPITAL_BASED_OUTPATIENT_CLINIC_OR_DEPARTMENT_OTHER)
Admission: EM | Admit: 2023-06-03 | Discharge: 2023-06-03 | Disposition: A | Discharge status: Home / Self Care | Payer: BC Managed Care – PPO | Attending: Emergency Medicine | Admitting: Emergency Medicine

## 2023-06-03 ENCOUNTER — Other Ambulatory Visit (HOSPITAL_BASED_OUTPATIENT_CLINIC_OR_DEPARTMENT_OTHER): Payer: Self-pay

## 2023-06-03 ENCOUNTER — Encounter (HOSPITAL_BASED_OUTPATIENT_CLINIC_OR_DEPARTMENT_OTHER): Payer: Self-pay | Admitting: Emergency Medicine

## 2023-06-03 ENCOUNTER — Other Ambulatory Visit: Payer: Self-pay

## 2023-06-03 ENCOUNTER — Emergency Department (HOSPITAL_BASED_OUTPATIENT_CLINIC_OR_DEPARTMENT_OTHER): Payer: BC Managed Care – PPO

## 2023-06-03 DIAGNOSIS — K5732 Diverticulitis of large intestine without perforation or abscess without bleeding: Secondary | ICD-10-CM | POA: Insufficient documentation

## 2023-06-03 DIAGNOSIS — R1032 Left lower quadrant pain: Secondary | ICD-10-CM | POA: Diagnosis not present

## 2023-06-03 DIAGNOSIS — R1012 Left upper quadrant pain: Secondary | ICD-10-CM | POA: Diagnosis not present

## 2023-06-03 DIAGNOSIS — N281 Cyst of kidney, acquired: Secondary | ICD-10-CM | POA: Diagnosis not present

## 2023-06-03 DIAGNOSIS — K5792 Diverticulitis of intestine, part unspecified, without perforation or abscess without bleeding: Secondary | ICD-10-CM | POA: Diagnosis not present

## 2023-06-03 DIAGNOSIS — Z7982 Long term (current) use of aspirin: Secondary | ICD-10-CM | POA: Diagnosis not present

## 2023-06-03 DIAGNOSIS — N200 Calculus of kidney: Secondary | ICD-10-CM | POA: Diagnosis not present

## 2023-06-03 DIAGNOSIS — K5730 Diverticulosis of large intestine without perforation or abscess without bleeding: Secondary | ICD-10-CM | POA: Diagnosis not present

## 2023-06-03 DIAGNOSIS — K429 Umbilical hernia without obstruction or gangrene: Secondary | ICD-10-CM | POA: Diagnosis not present

## 2023-06-03 DIAGNOSIS — R109 Unspecified abdominal pain: Secondary | ICD-10-CM | POA: Diagnosis not present

## 2023-06-03 LAB — URINALYSIS, ROUTINE W REFLEX MICROSCOPIC
Bacteria, UA: NONE SEEN
Bilirubin Urine: NEGATIVE
Glucose, UA: NEGATIVE mg/dL
Ketones, ur: NEGATIVE mg/dL
Leukocytes,Ua: NEGATIVE
Nitrite: NEGATIVE
Protein, ur: NEGATIVE mg/dL
Specific Gravity, Urine: 1.012 (ref 1.005–1.030)
pH: 5.5 (ref 5.0–8.0)

## 2023-06-03 LAB — CBC
HCT: 43.6 % (ref 39.0–52.0)
Hemoglobin: 15.2 g/dL (ref 13.0–17.0)
MCH: 33 pg (ref 26.0–34.0)
MCHC: 34.9 g/dL (ref 30.0–36.0)
MCV: 94.6 fL (ref 80.0–100.0)
Platelets: 226 10*3/uL (ref 150–400)
RBC: 4.61 MIL/uL (ref 4.22–5.81)
RDW: 12.3 % (ref 11.5–15.5)
WBC: 12 10*3/uL — ABNORMAL HIGH (ref 4.0–10.5)
nRBC: 0 % (ref 0.0–0.2)

## 2023-06-03 LAB — COMPREHENSIVE METABOLIC PANEL
ALT: 9 U/L (ref 0–44)
AST: 12 U/L — ABNORMAL LOW (ref 15–41)
Albumin: 4.4 g/dL (ref 3.5–5.0)
Alkaline Phosphatase: 38 U/L (ref 38–126)
Anion gap: 9 (ref 5–15)
BUN: 11 mg/dL (ref 8–23)
CO2: 29 mmol/L (ref 22–32)
Calcium: 9.6 mg/dL (ref 8.9–10.3)
Chloride: 102 mmol/L (ref 98–111)
Creatinine, Ser: 0.99 mg/dL (ref 0.61–1.24)
GFR, Estimated: >60 mL/min (ref >60)
Glucose, Bld: 110 mg/dL — ABNORMAL HIGH (ref 70–99)
Potassium: 3.8 mmol/L (ref 3.5–5.1)
Sodium: 140 mmol/L (ref 135–145)
Total Bilirubin: 0.8 mg/dL (ref 0.3–1.2)
Total Protein: 7.5 g/dL (ref 6.5–8.1)

## 2023-06-03 LAB — LIPASE, BLOOD: Lipase: 23 U/L (ref 11–51)

## 2023-06-03 MED ORDER — IOHEXOL 300 MG/ML  SOLN
100.0000 mL | Freq: Once | INTRAMUSCULAR | Status: AC | PRN
  Administered 2023-06-03: 80 mL via INTRAVENOUS

## 2023-06-03 MED ORDER — AMOXICILLIN-POT CLAVULANATE 875-125 MG PO TABS
1.0000 | ORAL_TABLET | Freq: Two times a day (BID) | ORAL | 0 refills | Status: DC
  Filled 2023-06-03: qty 14, 7d supply, fill #0

## 2023-06-03 MED ORDER — TAMSULOSIN HCL 0.4 MG PO CAPS
0.4000 mg | ORAL_CAPSULE | Freq: Every day | ORAL | 0 refills | Status: DC
  Filled 2023-06-03: qty 30, 30d supply, fill #0

## 2023-06-03 MED ORDER — TAMSULOSIN HCL 0.4 MG PO CAPS: 0.4000 mg | ORAL_CAPSULE | Freq: Every day | ORAL | 0 refills | Status: DC

## 2023-06-03 MED ORDER — AMOXICILLIN-POT CLAVULANATE 875-125 MG PO TABS: 1.0000 | ORAL_TABLET | Freq: Two times a day (BID) | ORAL | 0 refills | Status: DC

## 2023-06-03 NOTE — Discharge Instructions (Addendum)
As we discussed, imaging reveals that you have diverticulitis and a kidney stone on the left side.  These are both likely contributing to your pain.  For your kidney stone, I have given you a prescription for Flomax for you to take as prescribed every day while you are having pain.  If your pain resolves, or you notice that you have passed the stone, you may stop this medication.  I have given you a referral to urology with a number to call to schedule an appointment for follow-up.  Please call them at your next convenience.  I also recommend that you increase your fluid intake to help facilitate passing the stone.  Please take Tylenol/ibuprofen as needed for pain.  For your diverticulitis, have given you a prescription for Augmentin which is an antibiotic for you to fill and take as prescribed in its entirety.  I also recommend that you have a low fiber diet with bowel rest over the next few days.  I have attached additional information on foods to eat with this diagnosis.  Please call your primary care doctor in the next few days to schedule follow-up appointment.  Return if development of any new or worsening symptoms.

## 2023-06-03 NOTE — ED Notes (Signed)
Pt discharged to home, NAD noted at time of discharge 

## 2023-06-03 NOTE — ED Notes (Signed)
Patient transported to CT 

## 2023-06-03 NOTE — ED Notes (Signed)
ED Provider at bedside. 

## 2023-06-03 NOTE — ED Provider Notes (Cosign Needed Addendum)
EMERGENCY DEPARTMENT AT [facility] Provider Note   CSN: [id number] Arrival date & time: 06/03/23  1210     History  Chief Complaint  Patient presents with   Flank Pain    Paul Clark is a 62 y.o. male.  Patient with history of diverticulitis and hyperlipidemia presents today with complaints of abdominal pain and flank pain.  He states that same began Monday in his periumbilical region and did not radiate.  He had some diarrhea initially as well that was not bloody or melanous.  States that his pain has moved into his left lower quadrant and is in his left back area as well.  Currently his pain is mild but he states that it comes and goes without a discernible trigger.  He states that last night his pain was severe in his left flank and he could not get comfortable despite trying multiple positions to sleep.  He denies any fevers, chills, nausea, or vomiting.  No history of abdominal surgeries.  No hematuria or dysuria.  He is unsure if this is like his previous flares of diverticulitis or not.  The history is provided by the patient. No language interpreter was used.  Flank Pain Associated symptoms include abdominal pain.       Home Medications Prior to Admission medications   Medication Sig Start Date End Date Taking? Authorizing Provider  aspirin EC 81 MG tablet Take 1 tablet (81 mg total) by mouth daily. 01/29/20   Sande Rives, MD  ibuprofen (ADVIL) 800 MG tablet Take 1 tablet (800 mg total) by mouth 3 (three) times daily. For 10 days 01/29/20   Sande Rives, MD  rosuvastatin (CRESTOR) 10 MG tablet TAKE 1 TABLET(10 MG) BY MOUTH DAILY 03/19/21   O'Neal, Ronnald Ramp, MD      Allergies    Patient has no known allergies.    Review of Systems   Review of Systems  Gastrointestinal:  Positive for abdominal pain.  Genitourinary:  Positive for flank pain.  All other systems reviewed and are negative.   Physical Exam Updated Vital Signs BP  (!) 142/92 (BP Location: Right Arm)   Pulse 79   Temp 98.4 F (36.9 C)   Resp 20   SpO2 99%  Physical Exam Vitals and nursing note reviewed.  Constitutional:      General: He is not in acute distress.    Appearance: Normal appearance. He is normal weight. He is not ill-appearing, toxic-appearing or diaphoretic.  HENT:     Head: Normocephalic and atraumatic.  Cardiovascular:     Rate and Rhythm: Normal rate.  Pulmonary:     Effort: Pulmonary effort is normal. No respiratory distress.  Abdominal:     General: Abdomen is flat.     Palpations: Abdomen is soft.     Tenderness: There is abdominal tenderness in the suprapubic area and left lower quadrant. There is left CVA tenderness.  Musculoskeletal:        General: Normal range of motion.     Cervical back: Normal range of motion.  Skin:    General: Skin is warm and dry.  Neurological:     General: No focal deficit present.     Mental Status: He is alert.  Psychiatric:        Mood and Affect: Mood normal.        Behavior: Behavior normal.     ED Results / Procedures / Treatments   Labs (all labs ordered are listed,  but only abnormal results are displayed) Labs Reviewed  COMPREHENSIVE METABOLIC PANEL - Abnormal; Notable for the following components:      Result Value   Glucose, Bld 110 (*)    AST 12 (*)    All other components within normal limits  CBC - Abnormal; Notable for the following components:   WBC 12.0 (*)    All other components within normal limits  URINALYSIS, ROUTINE W REFLEX MICROSCOPIC - Abnormal; Notable for the following components:   Hgb urine dipstick TRACE (*)    All other components within normal limits  LIPASE, BLOOD    EKG None  Radiology CT ABDOMEN PELVIS W CONTRAST  Result Date: 06/03/2023 CLINICAL DATA:  Left lower quadrant pain. EXAM: CT ABDOMEN AND PELVIS WITH CONTRAST TECHNIQUE: Multidetector CT imaging of the abdomen and pelvis was performed using the standard protocol following  bolus administration of intravenous contrast. RADIATION DOSE REDUCTION: This exam was performed according to the departmental dose-optimization program which includes automated exposure control, adjustment of the mA and/or kV according to patient size and/or use of iterative reconstruction technique. CONTRAST:  80mL OMNIPAQUE IOHEXOL 300 MG/ML  SOLN COMPARISON:  Pelvic CT 09/12/2018 FINDINGS: Lower chest: There is some linear opacity lung bases likely scar or atelectasis. No pleural effusion. Hepatobiliary: No focal liver abnormality is seen. No gallstones, gallbladder wall thickening, or biliary dilatation. Patent portal vein Pancreas: Unremarkable. No pancreatic ductal dilatation or surrounding inflammatory changes. Spleen: Normal in size without focal abnormality. Adrenals/Urinary Tract: Adrenal glands are preserved. Nonobstructing 3 mm upper pole left-sided renal stone. Punctate lower pole right-sided stone and additional punctate upper pole right-sided stones. No enhancing renal mass or collecting system dilatation. Prominent bilateral parapelvic cysts. No imaging follow-up. The ureters have normal course and caliber down to the bladder. There is calcification along the bladder on the left side posteriorly. Based on appearance and location this could be along the left UVJ measuring 6 mm on series 2, image 75. This has not seen on the prior pelvic CT of 09/12/2018. otherwise preserved contours of the urinary bladder. Stomach/Bowel: No oral contrast. The large bowel demonstrates colonic diverticulosis. There is a focal area of wall thickening along the mid descending colon with stranding consistent with an area presumed diverticulitis. No proximal colonic obstruction. There is scattered stool. No free air or rim enhancing fluid collection at this time. Normal appendix. Stomach and small bowel are nondilated. Vascular/Lymphatic: Aortic atherosclerosis. No enlarged abdominal or pelvic lymph nodes. Reproductive:  Prostate is unremarkable. Other: No free air or fluid collections identified. Small fat containing umbilical hernia. Musculoskeletal: Mild curvature of the spine with some degenerative change. IMPRESSION: Wall thickening seen along the mid descending colon with significant surrounding stranding and diverticula consistent with an area of diverticulitis. Recommend follow-up to confirm clearance after treatment and exclude secondary pathology. Bilateral nonobstructing renal stones. There is also a 6 mm calcification in the location of the left UVJ without dilatation of the collecting systems. Please correlate with the history. Electronically Signed   By: Karen Kays M.D.   On: 06/03/2023 14:46    Procedures Procedures    Medications Ordered in ED Medications  iohexol (OMNIPAQUE) 300 MG/ML solution 100 mL (80 mLs Intravenous Contrast Given 06/03/23 1409)    ED Course/ Medical Decision Making/ A&P                             Medical Decision Making Amount and/or Complexity of Data Reviewed  Labs: ordered. Radiology: ordered.  Risk Prescription drug management.   This patient is a 62 y.o. male who presents to the ED for concern of abdominal pain, flank pain, this involves an extensive number of treatment options, and is a complaint that carries with it a high risk of complications and morbidity. The emergent differential diagnosis prior to evaluation includes, but is not limited to,  The differential diagnosis for generalized abdominal pain includes, but is not limited to AAA, gastroenteritis, appendicitis, Bowel obstruction, Bowel perforation. Gastroparesis, DKA, Hernia, Inflammatory bowel disease, mesenteric ischemia, pancreatitis, renal artery/vein embolism/thrombosis, mesenteric ischemia, pyelonephritis, nephrolithiasis, cystitis, biliary colic, perforated peptic ulcer, diverticulitis, Testicular torsion, Epididymitis  This is not an exhaustive differential.   Past Medical History /  Co-morbidities / Social History: history of diverticulitis and hyperlipidemia  Additional history: Chart reviewed. Pertinent results include: sent from pcp for labs and imaging  Physical Exam: Physical exam performed. The pertinent findings include: LLQ pain and left CVA tenderness  Lab Tests: I ordered, and personally interpreted labs.  The pertinent results include:  WBC 12, UA with trace hgb   Imaging Studies: I ordered imaging studies including CT abdomen pelvis. I independently visualized and interpreted imaging which showed   Wall thickening seen along the mid descending colon with significant surrounding stranding and diverticula consistent with an area of diverticulitis. Recommend follow-up to confirm clearance after treatment and exclude secondary pathology.   Bilateral nonobstructing renal stones. There is also a 6 mm calcification in the location of the left UVJ without dilatation of the collecting systems. Please correlate with the history.  I agree with the radiologist interpretation.   Medications: Offered pain medication which patient declined   Disposition: After consideration of the diagnostic results and the patients response to treatment, I feel that emergency department workup does not suggest an emergent condition requiring admission or immediate intervention beyond what has been performed at this time. The plan is: Discharge with Augmentin and recommendations for dietary changes for diverticulitis.  Patient does also have pain that sounds colicky in nature and has blood in his urine with a stone seen on imaging, therefore we will treat with Flomax and give urology referral for follow-up. Evaluation and diagnostic testing in the emergency department does not suggest an emergent condition requiring admission or immediate intervention beyond what has been performed at this time.  Plan for discharge with close PCP follow-up.  Patient is understanding and amenable with  plan, educated on red flag symptoms that would prompt immediate return.  Patient discharged in stable condition.  Findings and plan of care discussed with supervising physician Dr. Theresia Lo who is in agreement.    Final Clinical Impression(s) / ED Diagnoses Final diagnoses:  Kidney stone on left side  Acute diverticulitis    Rx / DC Orders ED Discharge Orders          Ordered    tamsulosin (FLOMAX) 0.4 MG CAPS capsule  Daily        06/03/23 1541    amoxicillin-clavulanate (AUGMENTIN) 875-125 MG tablet  Every 12 hours        06/03/23 1541          An After Visit Summary was printed and given to the patient.     Vear Clock 06/03/23 1545    Marselino Slayton, Shawn Route, PA-C 06/03/23 1545    Elayne Snare K, DO 06/04/23 [phone]

## 2023-06-03 NOTE — ED Triage Notes (Signed)
Pt sent by PCP for further eval and tx after being seen for mid lower abd pain that radiates to the left lower abd and L flank. Pt reports pain started Monday with initially having diarrhea. Pt denies N/V. Hx diverticulitis. Pt denies urinary s/s.

## 2023-06-06 DIAGNOSIS — R1084 Generalized abdominal pain: Secondary | ICD-10-CM | POA: Diagnosis not present

## 2023-06-06 DIAGNOSIS — N132 Hydronephrosis with renal and ureteral calculous obstruction: Secondary | ICD-10-CM | POA: Diagnosis not present

## 2023-06-08 DIAGNOSIS — K5792 Diverticulitis of intestine, part unspecified, without perforation or abscess without bleeding: Secondary | ICD-10-CM | POA: Diagnosis not present

## 2023-06-15 DIAGNOSIS — N201 Calculus of ureter: Secondary | ICD-10-CM | POA: Diagnosis not present

## 2023-07-09 DIAGNOSIS — K5792 Diverticulitis of intestine, part unspecified, without perforation or abscess without bleeding: Secondary | ICD-10-CM | POA: Diagnosis not present

## 2023-07-13 DIAGNOSIS — K5792 Diverticulitis of intestine, part unspecified, without perforation or abscess without bleeding: Secondary | ICD-10-CM | POA: Diagnosis not present

## 2023-07-25 DIAGNOSIS — R933 Abnormal findings on diagnostic imaging of other parts of digestive tract: Secondary | ICD-10-CM | POA: Diagnosis not present

## 2023-07-25 DIAGNOSIS — K5732 Diverticulitis of large intestine without perforation or abscess without bleeding: Secondary | ICD-10-CM | POA: Diagnosis not present

## 2023-12-28 DIAGNOSIS — M25562 Pain in left knee: Secondary | ICD-10-CM | POA: Diagnosis not present

## 2023-12-28 DIAGNOSIS — M79642 Pain in left hand: Secondary | ICD-10-CM | POA: Diagnosis not present

## 2023-12-28 DIAGNOSIS — R053 Chronic cough: Secondary | ICD-10-CM | POA: Diagnosis not present

## 2024-01-03 DIAGNOSIS — M2550 Pain in unspecified joint: Secondary | ICD-10-CM | POA: Diagnosis not present

## 2024-01-10 DIAGNOSIS — R7982 Elevated C-reactive protein (CRP): Secondary | ICD-10-CM | POA: Diagnosis not present

## 2024-01-10 DIAGNOSIS — R5383 Other fatigue: Secondary | ICD-10-CM | POA: Diagnosis not present

## 2024-01-10 DIAGNOSIS — M2550 Pain in unspecified joint: Secondary | ICD-10-CM | POA: Diagnosis not present

## 2024-01-10 DIAGNOSIS — R059 Cough, unspecified: Secondary | ICD-10-CM | POA: Diagnosis not present

## 2024-01-24 DIAGNOSIS — R053 Chronic cough: Secondary | ICD-10-CM | POA: Diagnosis not present

## 2024-01-25 DIAGNOSIS — R059 Cough, unspecified: Secondary | ICD-10-CM | POA: Diagnosis not present

## 2024-03-11 DIAGNOSIS — H6992 Unspecified Eustachian tube disorder, left ear: Secondary | ICD-10-CM | POA: Diagnosis not present

## 2024-03-13 DIAGNOSIS — Z0000 Encounter for general adult medical examination without abnormal findings: Secondary | ICD-10-CM | POA: Diagnosis not present

## 2024-03-13 DIAGNOSIS — E7800 Pure hypercholesterolemia, unspecified: Secondary | ICD-10-CM | POA: Diagnosis not present

## 2024-03-13 DIAGNOSIS — F5101 Primary insomnia: Secondary | ICD-10-CM | POA: Diagnosis not present

## 2024-03-13 DIAGNOSIS — Z125 Encounter for screening for malignant neoplasm of prostate: Secondary | ICD-10-CM | POA: Diagnosis not present

## 2024-04-24 DIAGNOSIS — R051 Acute cough: Secondary | ICD-10-CM | POA: Diagnosis not present

## 2024-04-24 DIAGNOSIS — Z20822 Contact with and (suspected) exposure to covid-19: Secondary | ICD-10-CM | POA: Diagnosis not present

## 2024-04-24 DIAGNOSIS — J069 Acute upper respiratory infection, unspecified: Secondary | ICD-10-CM | POA: Diagnosis not present

## 2024-05-09 DIAGNOSIS — L570 Actinic keratosis: Secondary | ICD-10-CM | POA: Diagnosis not present

## 2024-05-09 DIAGNOSIS — D2261 Melanocytic nevi of right upper limb, including shoulder: Secondary | ICD-10-CM | POA: Diagnosis not present

## 2024-05-09 DIAGNOSIS — D225 Melanocytic nevi of trunk: Secondary | ICD-10-CM | POA: Diagnosis not present

## 2024-05-09 DIAGNOSIS — L821 Other seborrheic keratosis: Secondary | ICD-10-CM | POA: Diagnosis not present

## 2024-06-25 DIAGNOSIS — S61412A Laceration without foreign body of left hand, initial encounter: Secondary | ICD-10-CM | POA: Diagnosis not present

## 2024-06-25 DIAGNOSIS — W541XXA Struck by dog, initial encounter: Secondary | ICD-10-CM | POA: Diagnosis not present

## 2024-06-25 DIAGNOSIS — Z23 Encounter for immunization: Secondary | ICD-10-CM | POA: Diagnosis not present

## 2024-07-25 DIAGNOSIS — J069 Acute upper respiratory infection, unspecified: Secondary | ICD-10-CM | POA: Diagnosis not present

## 2024-07-25 DIAGNOSIS — U071 COVID-19: Secondary | ICD-10-CM | POA: Diagnosis not present

## 2024-08-10 DIAGNOSIS — R030 Elevated blood-pressure reading, without diagnosis of hypertension: Secondary | ICD-10-CM | POA: Diagnosis not present

## 2024-08-10 DIAGNOSIS — H5310 Unspecified subjective visual disturbances: Secondary | ICD-10-CM | POA: Diagnosis not present

## 2024-08-10 DIAGNOSIS — R42 Dizziness and giddiness: Secondary | ICD-10-CM | POA: Diagnosis not present

## 2024-08-14 DIAGNOSIS — R232 Flushing: Secondary | ICD-10-CM | POA: Diagnosis not present

## 2024-08-14 DIAGNOSIS — I2510 Atherosclerotic heart disease of native coronary artery without angina pectoris: Secondary | ICD-10-CM | POA: Diagnosis not present

## 2024-08-14 DIAGNOSIS — R5383 Other fatigue: Secondary | ICD-10-CM | POA: Diagnosis not present

## 2024-08-14 DIAGNOSIS — F419 Anxiety disorder, unspecified: Secondary | ICD-10-CM | POA: Diagnosis not present

## 2024-08-14 DIAGNOSIS — R7301 Impaired fasting glucose: Secondary | ICD-10-CM | POA: Diagnosis not present

## 2024-08-14 DIAGNOSIS — R030 Elevated blood-pressure reading, without diagnosis of hypertension: Secondary | ICD-10-CM | POA: Diagnosis not present

## 2024-09-05 DIAGNOSIS — G4700 Insomnia, unspecified: Secondary | ICD-10-CM | POA: Diagnosis not present

## 2024-09-05 DIAGNOSIS — R351 Nocturia: Secondary | ICD-10-CM | POA: Diagnosis not present

## 2024-09-05 DIAGNOSIS — G4730 Sleep apnea, unspecified: Secondary | ICD-10-CM | POA: Diagnosis not present

## 2024-09-10 DIAGNOSIS — Z7689 Persons encountering health services in other specified circumstances: Secondary | ICD-10-CM | POA: Diagnosis not present

## 2024-09-10 DIAGNOSIS — R030 Elevated blood-pressure reading, without diagnosis of hypertension: Secondary | ICD-10-CM | POA: Diagnosis not present

## 2024-09-10 DIAGNOSIS — M25522 Pain in left elbow: Secondary | ICD-10-CM | POA: Diagnosis not present

## 2024-09-10 DIAGNOSIS — R42 Dizziness and giddiness: Secondary | ICD-10-CM | POA: Diagnosis not present

## 2024-10-15 DIAGNOSIS — G4733 Obstructive sleep apnea (adult) (pediatric): Secondary | ICD-10-CM | POA: Diagnosis not present

## 2024-10-16 ENCOUNTER — Encounter (HOSPITAL_BASED_OUTPATIENT_CLINIC_OR_DEPARTMENT_OTHER): Payer: Self-pay

## 2024-10-16 NOTE — Progress Notes (Signed)
 Cardiology Office Note:  .   Date:  10/17/2024 ID:  Paul Clark, DOB 11-14-1961, MRN [id number] PCP: Regino Slater, MD [facility] Providers Cardiologist:  None   Patient Profile: .      PMH Aortic atherosclerosis Coronary artery disease CT Calcium  score 12/15/2022 CAC score 124 (71st percentile) LM 0, LAD 102, LCx 21.8, RCA 0 Coronary CTA 01/25/2020 CAC score of 20 (82nd percentile) Mild nonobstructive CAD in mid LAD (25-49%) Minimal nonobstructive CAD in LCx/RCA (< 25%) Hyperlipidemia Elevated blood pressure reading Prediabetes    History of Present Illness: .    Discussed the use of AI scribe software for clinical note transcription with the patient, who gave verbal consent to proceed.  History of Present Illness Paul Clark is a very pleasant 63 year old male who presents for follow-up of hypertension and coronary artery disease. Reports since having COVID-19 infection 3 months prior, he has noted elevated BP readings, dizziness, headaches, and chest pain. Systolic BP was 140s to 150s, accompanied by dizziness and headaches. He took Paxlovid for management of COVID-19. Over the past 7 days, BP has improved and is now generally 130/80 or lower. He started a supplement called Carditone daily to help lower BP, as advised by his sister-in-law who received the recommendation from a physician. CT calcium  score of 124 in January 2024.  Coronary CTA to see last 2021 revealed mild nonobstructive CAD in LAD and minimal disease in LCx/RCA.  Most recent cholesterol panel in April showed an LDL of 100. He reports occasional chest discomfort over the past couple of months, associated with the period of elevated blood pressure and COVID-19 symptoms. His family history includes high cholesterol in both parents, who are 37 years old and have not had heart attacks or strokes. He walks 3.5 miles daily, maintains a healthy diet, and occasionally consumes wine. His walking involves inclines when at  his home in [place]. He denies dyspnea, orthopnea, PND, edema, presyncope, syncope.  Unfortunately, his son passed away at age 29 from an arrhythmia. He saw multiple healthcare providers and no abnormalities were found prior to his death. No abnormal heart rhythms or palpitations have been detected by patient's Apple Watch.  Family history: His family history includes Cancer in his mother and another family member; Dementia in his mother; Diabetes in an other family member; Heart disease in his child; Hyperlipidemia in his father and mother; Hypertension in his father.    Diet: Fish, more lean protein little red meat Frequent vegetables 2 cups coffee with honey About 3 glasses of wine per week  Activity: Walks daily 3 miles - takes 1 1/2 hours for past 1.5 years Previously very sedentary Occasional golf  No results found for: LIPOA    ROS: See HPI       Studies Reviewed: SABRA   EKG Interpretation Date/Time:  Wednesday October 17 2024 10:06:16 EST Ventricular Rate:  61 PR Interval:  152 QRS Duration:  76 QT Interval:  422 QTC Calculation: 424 R Axis:   23  Text Interpretation: Normal sinus rhythm Normal ECG When compared with ECG of 20-May-2010 13:44, No significant change was found Confirmed by Percy Browning [id number]) on 10/17/2024 10:15:23 AM      Risk Assessment/Calculations:             Physical Exam:   VS: BP 122/68 (BP Location: Right Arm, Patient Position: Sitting, Cuff Size: Normal)   Pulse 61   Ht 5' 8 (1.727 m)   Wt 170 lb (  77.1 kg)   SpO2 95%   BMI 25.85 kg/m   Wt Readings from Last 3 Encounters:  10/17/24 170 lb (77.1 kg)  01/29/20 170 lb (77.1 kg)  12/18/19 177 lb (80.3 kg)     GEN: Well nourished, well developed in no acute distress NECK: No JVD; No carotid bruits CARDIAC: RRR, no murmurs, rubs, gallops RESPIRATORY:  Clear to auscultation without rales, wheezing or rhonchi  ABDOMEN: Soft, non-tender, non-distended EXTREMITIES:  No edema; No  deformity     ASSESSMENT AND PLAN: .    Assessment & Plan CAD Cardiac risk Chest pain Mild nonobstructive coronary artery disease on coronary CTA 01/2020 with follow-up CTs calcium  score of 124, (71st percentile) with calcification in LAD and circumflex arteries. Discussed the differences between calcified and soft plaque, emphasizing the importance of lipid-lowering therapy. Had left-sided chest discomfort during 21-month period of COVID-19 infection and recovery.  He also had dizziness and elevated BP.  Due to concerning symptoms and known CAD with LDL not at goal, we will get ischemia evaluation. - Coronary CTA for evaluation of ischemia -  Lopressor  25 mg 2 hours prior to CT - Repeat echocardiogram to evaluate heart and valve function - Repeat lipid testing for goal LDL 70 or lower - Continue regular exercise along with heart healthy diet avoiding saturated fat, processed food, sugar, and other simple carbohydrates  Hyperlipidemia LDL goal < 70   Lipid panel 03/13/24: total cholesterol 180, triglycerides 65, HDL 67, LDL-C 101.  He has been on rosuvastatin  20 mg daily for at least 6 months.  He admits to inconsistent use prior to that time. Emphasized the importance of LDL goal 70 or lower.   - Continue rosuvastatin  - Recheck fasting lipid panel along with lipoprotein a for further risk stratification - Continue regular physical activity and heart healthy diet  Elevated BP Had elevated BP with systolic readings 140s to 150s for approximately 3 weeks accompanied by dizziness.  Over the past 7 days, he brings records from home which reveal BP has improved (average SBP 118). He feels elevated BP was associated with COVID infection and subsequent treatment with Paxlovid.  BP is now stable. He is not on anti-hypertensive therapy and there is no indication to start medication now.  - Continue routine BP monitoring     Dispo: 3 months with me  Signed, Rosaline Bane, NP-C

## 2024-10-17 ENCOUNTER — Encounter (HOSPITAL_BASED_OUTPATIENT_CLINIC_OR_DEPARTMENT_OTHER): Payer: Self-pay | Admitting: Nurse Practitioner

## 2024-10-17 ENCOUNTER — Ambulatory Visit (INDEPENDENT_AMBULATORY_CARE_PROVIDER_SITE_OTHER): Admitting: Nurse Practitioner

## 2024-10-17 VITALS — BP 122/68 | HR 61 | Ht 68.0 in | Wt 170.0 lb

## 2024-10-17 DIAGNOSIS — Z7189 Other specified counseling: Secondary | ICD-10-CM

## 2024-10-17 DIAGNOSIS — E785 Hyperlipidemia, unspecified: Secondary | ICD-10-CM

## 2024-10-17 DIAGNOSIS — R079 Chest pain, unspecified: Secondary | ICD-10-CM

## 2024-10-17 DIAGNOSIS — I2510 Atherosclerotic heart disease of native coronary artery without angina pectoris: Secondary | ICD-10-CM

## 2024-10-17 DIAGNOSIS — R030 Elevated blood-pressure reading, without diagnosis of hypertension: Secondary | ICD-10-CM | POA: Diagnosis not present

## 2024-10-17 MED ORDER — METOPROLOL TARTRATE 25 MG PO TABS: 25.0000 mg | ORAL_TABLET | Freq: Once | ORAL | 0 refills | Status: AC

## 2024-10-17 NOTE — Patient Instructions (Addendum)
 Medication Instructions:   Your physician recommends that you continue on your current medications as directed. Please refer to the Current Medication list given to you today.   *If you need a refill on your cardiac medications before your next appointment, please call your pharmacy*  Lab Work: Your physician recommends that you have lab work TODAY!!  NMR Lipoprofile and Lp(a)  If you have labs (blood work) drawn today and your tests are completely normal, you will receive your results only by: MyChart Message (if you have MyChart) OR A paper copy in the mail If you have any lab test that is abnormal or we need to change your treatment, we will call you to review the results.  Testing/Procedures:    Your cardiac CT will be scheduled at one of the below locations:   [facility] [address]  [place], [place] [address]  If scheduled at the [facility] at [address], please enter the parking lot using the [address] entrance and use the FREE valet service at the patient drop-off area. Enter the building and check-in with registration on the main floor.  Please follow these instructions carefully (unless otherwise directed):  An IV will be required for this test and Nitroglycerin  will be given.  Hold all erectile dysfunction medications at least 3 days (72 hrs) prior to test. (Ie viagra, cialis, sildenafil, tadalafil, etc)   On the Night Before the Test: Be sure to Drink plenty of water. Do not consume any caffeinated/decaffeinated beverages or chocolate 12 hours prior to your test. Do not take any antihistamines 12 hours prior to your test.  On the Day of the Test: Drink plenty of water until 1 hour prior to the test. Do not eat any food 1 hour prior to test. You may take your regular medications prior to the test.  Take metoprolol  (Lopressor ) one (1) tablet by mouth ( 25 mg) two hours prior to test.       After the  Test: Drink plenty of water. After receiving IV contrast, you may experience a mild flushed feeling. This is normal. On occasion, you may experience a mild rash up to 24 hours after the test. This is not dangerous. If this occurs, you can take Benadryl 25 mg, Zyrtec, Claritin, or Allegra and increase your fluid intake. (Patients taking Tikosyn should avoid Benadryl, and may take Zyrtec, Claritin, or Allegra) If you experience trouble breathing, this can be serious. If it is severe call 911 IMMEDIATELY. If it is mild, please call our office.  We will call to schedule your test 2-4 weeks out understanding that some insurance companies will need an authorization prior to the service being performed.   For more information and frequently asked questions, please visit our website : [email or web address]  For non-scheduling related questions, please contact the cardiac imaging nurse navigator should you have any questions/concerns: Cardiac Imaging Nurse Navigators Direct Office Dial: [phone]   For scheduling needs, including cancellations and rescheduling, please call Brittany, [phone].   Your physician has requested that you have an echocardiogram. Echocardiography is a painless test that uses sound waves to create images of your heart. It provides your doctor with information about the size and shape of your heart and how well your heart's chambers and valves are working. This procedure takes approximately one hour. There are no restrictions for this procedure. Please do NOT wear cologne, aftershave, or lotions (deodorant is allowed). Please arrive 15 minutes prior to your  appointment time.   Follow-Up: At [facility], you and your health needs are our priority.  As part of our continuing mission to provide you with exceptional heart care, our providers are all part of one team.  This team includes your primary Cardiologist (physician) and Advanced Practice Providers  or APPs (Physician Assistants and Nurse Practitioners) who all work together to provide you with the care you need, when you need it.  Your next appointment:   3 month(s)  Provider:   Rosaline Bane, NP in [facility]  We recommend signing up for the patient portal called MyChart.  Sign up information is provided on this After Visit Summary.  MyChart is used to connect with patients for Virtual Visits (Telemedicine).  Patients are able to view lab/test results, encounter notes, upcoming appointments, etc.  Non-urgent messages can be sent to your provider as well.   To learn more about what you can do with MyChart, go to [email or web address].

## 2024-10-19 LAB — LIPOPROTEIN A (LPA): Lipoprotein (a): 184.1 nmol/L — ABNORMAL HIGH (ref <75.0)

## 2024-10-20 ENCOUNTER — Encounter (HOSPITAL_BASED_OUTPATIENT_CLINIC_OR_DEPARTMENT_OTHER): Payer: Self-pay

## 2024-10-24 ENCOUNTER — Other Ambulatory Visit (HOSPITAL_BASED_OUTPATIENT_CLINIC_OR_DEPARTMENT_OTHER): Payer: Self-pay | Admitting: *Deleted

## 2024-10-24 ENCOUNTER — Encounter (HOSPITAL_BASED_OUTPATIENT_CLINIC_OR_DEPARTMENT_OTHER): Payer: Self-pay | Admitting: *Deleted

## 2024-10-24 DIAGNOSIS — R079 Chest pain, unspecified: Secondary | ICD-10-CM

## 2024-10-24 DIAGNOSIS — I2510 Atherosclerotic heart disease of native coronary artery without angina pectoris: Secondary | ICD-10-CM

## 2024-10-25 ENCOUNTER — Ambulatory Visit (HOSPITAL_BASED_OUTPATIENT_CLINIC_OR_DEPARTMENT_OTHER): Payer: Self-pay | Admitting: Nurse Practitioner

## 2024-11-12 ENCOUNTER — Other Ambulatory Visit (HOSPITAL_BASED_OUTPATIENT_CLINIC_OR_DEPARTMENT_OTHER)

## 2024-11-14 ENCOUNTER — Encounter (HOSPITAL_COMMUNITY): Payer: Self-pay

## 2024-11-14 DIAGNOSIS — G4733 Obstructive sleep apnea (adult) (pediatric): Secondary | ICD-10-CM | POA: Diagnosis not present

## 2024-11-16 ENCOUNTER — Ambulatory Visit (HOSPITAL_COMMUNITY)
Admission: RE | Admit: 2024-11-16 | Discharge: 2024-11-16 | Attending: Nurse Practitioner | Admitting: Nurse Practitioner

## 2024-11-16 ENCOUNTER — Other Ambulatory Visit (INDEPENDENT_AMBULATORY_CARE_PROVIDER_SITE_OTHER)

## 2024-11-16 DIAGNOSIS — R079 Chest pain, unspecified: Secondary | ICD-10-CM | POA: Diagnosis not present

## 2024-11-16 DIAGNOSIS — I2510 Atherosclerotic heart disease of native coronary artery without angina pectoris: Secondary | ICD-10-CM | POA: Diagnosis not present

## 2024-11-16 LAB — ECHOCARDIOGRAM COMPLETE
AR max vel: 2.12 cm2
AV Area VTI: 2.49 cm2
AV Area mean vel: 2.24 cm2
AV Mean grad: 3 mmHg
AV Peak grad: 6 mmHg
AV Vena cont: 0.45 cm
Ao pk vel: 1.22 m/s
Area-P 1/2: 5.06 cm2
P 1/2 time: 706 ms
S' Lateral: 3.91 cm

## 2024-11-16 MED ORDER — NITROGLYCERIN 0.4 MG SL SUBL
0.8000 mg | SUBLINGUAL_TABLET | Freq: Once | SUBLINGUAL | Status: AC
  Administered 2024-11-16: 0.8 mg via SUBLINGUAL

## 2024-11-16 MED ORDER — IOHEXOL 350 MG/ML SOLN
80.0000 mL | Freq: Once | INTRAVENOUS | Status: AC | PRN
  Administered 2024-11-16: 80 mL via INTRAVENOUS

## 2024-11-24 ENCOUNTER — Encounter (HOSPITAL_BASED_OUTPATIENT_CLINIC_OR_DEPARTMENT_OTHER): Payer: Self-pay

## 2024-11-28 DIAGNOSIS — R079 Chest pain, unspecified: Secondary | ICD-10-CM | POA: Diagnosis not present

## 2024-11-28 DIAGNOSIS — I2510 Atherosclerotic heart disease of native coronary artery without angina pectoris: Secondary | ICD-10-CM | POA: Diagnosis not present

## 2024-11-30 LAB — NMR, LIPOPROFILE
Cholesterol, Total: 189 mg/dL (ref 100–199)
HDL Particle Number: 36.7 umol/L
HDL-C: 69 mg/dL
LDL Particle Number: 1153 nmol/L — ABNORMAL HIGH
LDL Size: 21.1 nm
LDL-C (NIH Calc): 107 mg/dL — ABNORMAL HIGH (ref 0–99)
LP-IR Score: <25
Small LDL Particle Number: 312 nmol/L
Triglycerides: 68 mg/dL (ref 0–149)

## 2024-12-10 ENCOUNTER — Encounter (HOSPITAL_BASED_OUTPATIENT_CLINIC_OR_DEPARTMENT_OTHER): Payer: Self-pay

## 2025-01-14 ENCOUNTER — Ambulatory Visit (HOSPITAL_BASED_OUTPATIENT_CLINIC_OR_DEPARTMENT_OTHER): Admitting: Nurse Practitioner
